# Patient Record
Sex: Female | Born: 1965 | Race: White | Hispanic: No | Marital: Married | Smoking: Former smoker
Health system: Southern US, Community
[De-identification: ages and names within clinical notes are randomized; demographics above are authoritative.]

## PROBLEM LIST (undated history)

## (undated) DIAGNOSIS — K219 Gastro-esophageal reflux disease without esophagitis: Secondary | ICD-10-CM

## (undated) DIAGNOSIS — C539 Malignant neoplasm of cervix uteri, unspecified: Secondary | ICD-10-CM

## (undated) DIAGNOSIS — G629 Polyneuropathy, unspecified: Secondary | ICD-10-CM

## (undated) DIAGNOSIS — Z1379 Encounter for other screening for genetic and chromosomal anomalies: Secondary | ICD-10-CM

## (undated) DIAGNOSIS — Z9889 Other specified postprocedural states: Secondary | ICD-10-CM

## (undated) DIAGNOSIS — C50919 Malignant neoplasm of unspecified site of unspecified female breast: Secondary | ICD-10-CM

## (undated) DIAGNOSIS — Z8719 Personal history of other diseases of the digestive system: Secondary | ICD-10-CM

## (undated) DIAGNOSIS — F329 Major depressive disorder, single episode, unspecified: Secondary | ICD-10-CM

## (undated) DIAGNOSIS — F32A Depression, unspecified: Secondary | ICD-10-CM

## (undated) DIAGNOSIS — R569 Unspecified convulsions: Secondary | ICD-10-CM

## (undated) DIAGNOSIS — A048 Other specified bacterial intestinal infections: Secondary | ICD-10-CM

## (undated) DIAGNOSIS — C4490 Unspecified malignant neoplasm of skin, unspecified: Secondary | ICD-10-CM

## (undated) HISTORY — PX: BREAST CYST ASPIRATION: SHX578

## (undated) HISTORY — DX: Encounter for other screening for genetic and chromosomal anomalies: Z13.79

## (undated) HISTORY — PX: APPENDECTOMY: SHX54

## (undated) HISTORY — PX: ABDOMINAL HYSTERECTOMY: SHX81

## (undated) HISTORY — DX: Unspecified malignant neoplasm of skin, unspecified: C44.90

## (undated) HISTORY — DX: Malignant neoplasm of cervix uteri, unspecified: C53.9

---

## 2000-08-26 ENCOUNTER — Other Ambulatory Visit: Admission: RE | Admit: 2000-08-26 | Discharge: 2000-08-26 | Payer: Self-pay | Admitting: Family Medicine

## 2003-01-13 HISTORY — PX: OOPHORECTOMY: SHX86

## 2004-01-13 HISTORY — PX: COLONOSCOPY: SHX174

## 2004-03-25 ENCOUNTER — Ambulatory Visit: Payer: Self-pay | Admitting: Unknown Physician Specialty

## 2004-04-02 ENCOUNTER — Ambulatory Visit: Payer: Self-pay | Admitting: Unknown Physician Specialty

## 2004-04-29 ENCOUNTER — Ambulatory Visit: Payer: Self-pay | Admitting: General Surgery

## 2004-05-20 ENCOUNTER — Ambulatory Visit: Payer: Self-pay | Admitting: General Surgery

## 2004-05-20 HISTORY — PX: CHOLECYSTECTOMY: SHX55

## 2005-01-12 DIAGNOSIS — R569 Unspecified convulsions: Secondary | ICD-10-CM

## 2005-01-12 HISTORY — PX: LASIK: SHX215

## 2005-01-12 HISTORY — DX: Unspecified convulsions: R56.9

## 2005-06-01 ENCOUNTER — Ambulatory Visit: Payer: Self-pay | Admitting: General Surgery

## 2006-04-27 ENCOUNTER — Ambulatory Visit: Payer: Self-pay | Admitting: Family Medicine

## 2006-05-31 ENCOUNTER — Ambulatory Visit: Payer: Self-pay | Admitting: Unknown Physician Specialty

## 2006-11-04 ENCOUNTER — Ambulatory Visit: Payer: Self-pay | Admitting: Otolaryngology

## 2006-11-10 IMAGING — NM NUCLEAR MEDICINE HEPATOHBILIARY INCLUDE GB
2 series · 12 of 12 positions shown · non-contrast
Comparison: none

REASON FOR EXAM: Nausea with vomiting, periumbilical abdominal pain
COMMENTS:

[Series 1: gallbladder dynamic (results) · 4.80mm/px · 6 of 60 frames shown]
[frame 6/60]
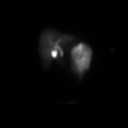
[frame 16/60]
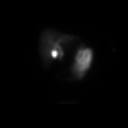
[frame 26/60]
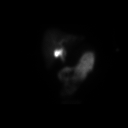
[frame 36/60]
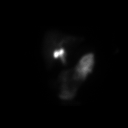
[frame 46/60]
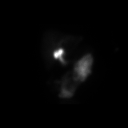
[frame 56/60]
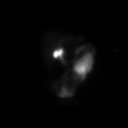

[Series 1: gallbladder dynamic · 4.80mm/px · 6 of 60 frames shown]
[frame 6/60]
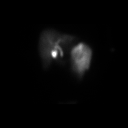
[frame 16/60]
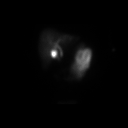
[frame 26/60]
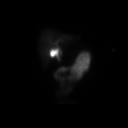
[frame 36/60]
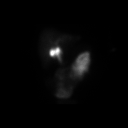
[frame 46/60]
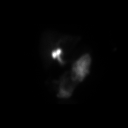
[frame 56/60]
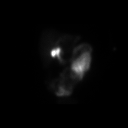

[12 of 12 positions shown; findings below may reference images not displayed]

PROCEDURE:     NM  - NM HEPATO WITH GB EJECT FRACTION  - March 25, 2004 [DATE]

RESULT:     Following injection of 7.20 millicuries of technetium-44m
Choletec, there was noted prompt visualization of the tracer activity in the
liver at five minutes.  At 40 minutes, tracer activity is visualized in the
gallbladder, common duct, and multiple loops of small bowel.  Following
injection of CCK, the gallbladder ejection fraction at 30 minutes measures
40% which is within the normal range.  A note is made that the patient
reported moderate cramping during the time of CCK injection.
IMPRESSION: Normal hepatobiliary scan.

The patient experienced cramping during the injection of CCK but the
gallbladder ejection fraction at 30 minutes measures 40% which is within the
normal range.

## 2008-03-05 ENCOUNTER — Ambulatory Visit: Payer: Self-pay | Admitting: General Practice

## 2008-06-12 ENCOUNTER — Ambulatory Visit: Payer: Self-pay | Admitting: Internal Medicine

## 2008-06-28 ENCOUNTER — Ambulatory Visit: Payer: Self-pay | Admitting: General Practice

## 2009-01-12 HISTORY — PX: BREAST BIOPSY: SHX20

## 2009-08-12 ENCOUNTER — Ambulatory Visit: Payer: Self-pay

## 2009-08-29 ENCOUNTER — Ambulatory Visit: Payer: Self-pay | Admitting: Unknown Physician Specialty

## 2009-08-29 HISTORY — PX: COLONOSCOPY W/ POLYPECTOMY: SHX1380

## 2009-09-05 ENCOUNTER — Ambulatory Visit: Payer: Self-pay

## 2009-10-07 HISTORY — PX: BREAST SURGERY: SHX581

## 2010-03-19 ENCOUNTER — Ambulatory Visit: Payer: Self-pay | Admitting: General Surgery

## 2011-01-22 ENCOUNTER — Ambulatory Visit: Payer: Self-pay | Admitting: Physician Assistant

## 2011-03-06 ENCOUNTER — Ambulatory Visit: Payer: Self-pay

## 2011-03-08 LAB — BETA STREP CULTURE(ARMC)

## 2011-03-26 ENCOUNTER — Ambulatory Visit: Payer: Self-pay | Admitting: General Surgery

## 2011-05-29 ENCOUNTER — Ambulatory Visit: Payer: Self-pay | Admitting: General Practice

## 2012-03-29 ENCOUNTER — Ambulatory Visit: Payer: Self-pay | Admitting: Physician Assistant

## 2012-04-21 ENCOUNTER — Ambulatory Visit: Payer: Self-pay | Admitting: Physician Assistant

## 2012-07-21 ENCOUNTER — Encounter: Payer: Self-pay | Admitting: *Deleted

## 2012-08-09 ENCOUNTER — Encounter: Payer: Self-pay | Admitting: *Deleted

## 2012-08-15 ENCOUNTER — Ambulatory Visit (INDEPENDENT_AMBULATORY_CARE_PROVIDER_SITE_OTHER): Payer: 59 | Admitting: General Surgery

## 2012-08-15 ENCOUNTER — Encounter: Payer: Self-pay | Admitting: General Surgery

## 2012-08-15 ENCOUNTER — Other Ambulatory Visit: Payer: Self-pay | Admitting: General Surgery

## 2012-08-15 VITALS — BP 108/70 | HR 78 | Resp 14 | Ht 66.0 in | Wt 160.0 lb

## 2012-08-15 DIAGNOSIS — K219 Gastro-esophageal reflux disease without esophagitis: Secondary | ICD-10-CM

## 2012-08-15 NOTE — Progress Notes (Signed)
Patient ID: Tammy Schroeder, female   DOB: 1965/05/12, 47 y.o.   MRN: [id number]  Chief Complaint  Patient presents with  . Other    Reflux    HPI Tammy Schroeder is a 47 y.o. female  Here for problems with reflux that have been ongoing for over a year. She has been treated twice for H-Pylori infections. She currently takes Omeprazole daily for symptom relief. She also has had problems with constipation her whole life. This is treated with laxatives every other day to relieve symptoms. Her last upper endoscopy was done in August 2011 by Dr Markham Jordan. Her last colonoscopy was done 08/29/2009 and the polyp found was benign.  At the time of her 04/14/2011 exam she had had a recurrent episode of abdominal pain similar to that noted in 2011 when she was noted to be positive for H. pylori. She received a course of Biaxin, amoxicillin and lansoprazole.  In the last 10 months she's had some recurrent reflux symptoms, usually when she misses a dose of omeprazole. As long as she is on therapy she is asymptomatic.  The patient has a long history of constipation. Multiple regimens have been tried. She's on success with daily stool softeners and every other day Correctol tablets.   The patient had previously been evaluated by Lynnae Prude, M.D. At the time of her 2011 followup exam after her endoscopy the staff was apparently somewhat curt, and she is decided not to return to that office.   HPI  History reviewed. No pertinent past medical history.  Past Surgical History  Procedure Laterality Date  . Appendectomy    . Cholecystectomy    . Abdominal hysterectomy      No family history on file.  Social History History  Substance Use Topics  . Smoking status: Former Smoker -- 1.00 packs/day for 29 years  . Smokeless tobacco: Never Used  . Alcohol Use: No    Allergies  Allergen Reactions  . Sulfa Antibiotics     Current Outpatient Prescriptions  Medication Sig Dispense Refill  . atorvastatin  (LIPITOR) 10 MG tablet Take 10 mg by mouth daily.      Tery Sanfilippo Sodium (CORRECTOL EXTRA GENTLE PO) Take 1 capsule by mouth daily.      Marland Kitchen estradiol (ESTRACE) 1 MG tablet Take 1 tablet by mouth daily.      Marland Kitchen zolpidem (AMBIEN) 5 MG tablet Take 2.5 mg by mouth at bedtime as needed for sleep.       No current facility-administered medications for this visit.    Review of Systems Review of Systems  Constitutional: Negative.   Respiratory: Negative.   Cardiovascular: Negative.   Gastrointestinal: Positive for constipation. Negative for nausea, vomiting, abdominal pain, diarrhea, blood in stool, abdominal distention, anal bleeding and rectal pain.    Blood pressure 108/70, pulse 78, resp. rate 14, height 5\' 6"  (1.676 m), weight 160 lb (72.576 kg).  Physical Exam Physical Exam  Constitutional: She is oriented to person, place, and time. She appears well-developed and well-nourished.  Neck: Neck supple.  Cardiovascular: Normal rate, regular rhythm and normal heart sounds.   Pulmonary/Chest: Effort normal and breath sounds normal.  Abdominal: Soft. Normal appearance and bowel sounds are normal. There is no hepatosplenomegaly. There is no tenderness.  Lymphadenopathy:    She has no cervical adenopathy.  Neurological: She is alert and oriented to person, place, and time.    Data Reviewed Upper lower endoscopy dated 08/29/2009 showed a hiatus hernia as well as gastritis. Biopsies  showed H. pylori at both the antrum and in the body of the stomach.  Colonoscopy showed a small polyp in the ascending colon measuring 4 mm in diameter. This was adenomatous.  H. pylori IgG testing of 08/29/2009 showed an elevated value of 4.7 (normal less than 0.9)  Assessment    Gastroesophageal reflux.    Plan    As the patient has noted more persistent symptoms in the last year repeat endoscopy is indicated. She has discontinued smoking in the last 2 years, which I would have expected to diminish her  symptoms.    Patient has been scheduled for an upper endoscopy on 08-23-12 at [facility].   Earline Mayotte 08/15/2012, 6:51 PM

## 2012-08-15 NOTE — Patient Instructions (Addendum)
Upper GI Endoscopy Upper GI endoscopy means using a flexible scope to look at the esophagus, stomach, and upper small bowel. This is done to make a diagnosis in people with heartburn, abdominal pain, or abnormal bleeding. Sometimes an endoscope is needed to remove foreign bodies or food that become stuck in the esophagus; it can also be used to take biopsy samples. For the best results, do not eat or drink for 8 hours before having your upper endoscopy.  To perform the endoscopy, you will probably be sedated and your throat will be numbed with a special spray. The endoscope is then slowly passed down your throat (this will not interfere with your breathing). An endoscopy exam takes 15 to 30 minutes to complete and there is no real pain. Patients rarely remember much about the procedure. The results of the test may take several days if a biopsy or other test is taken.  You may have a sore throat after an endoscopy exam. Serious complications are very rare. Stick to liquids and soft foods until your pain is better. Do not drive a car or operate any dangerous equipment for at least 24 hours after being sedated. SEEK IMMEDIATE MEDICAL CARE IF:   You have severe throat pain.   You have shortness of breath.   You have bleeding problems.   You have a fever.   You have difficulty recovering from your sedation.  Document Released: 02/06/2004 Document Revised: 12/18/2010 Document Reviewed: 01/01/2008 [facility] Patient Information 2012 [place], [place].  Patient has been scheduled for an upper endoscopy on 08-23-12 at [facility].

## 2012-08-22 ENCOUNTER — Telehealth: Payer: Self-pay | Admitting: *Deleted

## 2012-08-22 NOTE — Telephone Encounter (Signed)
Patient called and spoke with Marisue Humble this morning. This patient wishes to cancel upper endoscopy that was scheduled at [facility] for 08-23-12 due to finances. Trish in Endoscopy notified of cancellation.

## 2012-08-23 ENCOUNTER — Telehealth: Payer: Self-pay | Admitting: *Deleted

## 2012-08-23 DIAGNOSIS — A048 Other specified bacterial intestinal infections: Secondary | ICD-10-CM

## 2012-08-23 DIAGNOSIS — K219 Gastro-esophageal reflux disease without esophagitis: Secondary | ICD-10-CM

## 2012-08-23 NOTE — Telephone Encounter (Signed)
-----   Message from Tammy Mayotte, MD sent at 08/23/2012 4:05 PM ----- See if patient is amenable to getting an h pylori IgG blood test completed to assess for recurrent infection. Thanks.   Patient states she is willing to have repeat testing at [facility] lab. Lab slip has already been taken to the lab.

## 2012-08-23 NOTE — Telephone Encounter (Signed)
EGD cancelled. Will see if patient willing to have repeat H pylori testing to see if additional treatment is required.

## 2012-09-01 ENCOUNTER — Telehealth: Payer: Self-pay | Admitting: *Deleted

## 2012-09-01 NOTE — Telephone Encounter (Signed)
Patient states she has not had blood test completed yet because she works in [place]. She plans to have labs drawn tomorrow morning.

## 2012-09-03 LAB — H. PYLORI ANTIBODY, IGG: H Pylori IgG: 7.3 U/mL — ABNORMAL HIGH (ref 0.0–0.8)

## 2012-09-06 ENCOUNTER — Other Ambulatory Visit: Payer: Self-pay | Admitting: General Surgery

## 2012-09-06 ENCOUNTER — Telehealth: Payer: Self-pay | Admitting: *Deleted

## 2012-09-06 DIAGNOSIS — A048 Other specified bacterial intestinal infections: Secondary | ICD-10-CM

## 2012-09-06 MED ORDER — PANTOPRAZOLE SODIUM 40 MG PO TBEC: 40.0000 mg | DELAYED_RELEASE_TABLET | Freq: Two times a day (BID) | ORAL | Status: DC

## 2012-09-06 MED ORDER — AMOXICILLIN 500 MG PO CAPS: 1000.0000 mg | ORAL_CAPSULE | Freq: Two times a day (BID) | ORAL | Status: DC

## 2012-09-06 MED ORDER — CLARITHROMYCIN 500 MG PO TABS: 500.0000 mg | ORAL_TABLET | Freq: Two times a day (BID) | ORAL | Status: AC

## 2012-09-06 MED ORDER — FLUCONAZOLE 150 MG PO TABS: 150.0000 mg | ORAL_TABLET | ORAL | Status: DC

## 2012-09-06 NOTE — Telephone Encounter (Signed)
Please notify the patient that her blood test shows a new infection w/ H. Pylori. Would recommend RX. If amenable, I'll send in RXs.  Notified patient as instructed, Yes she would like RX called in .

## 2012-09-06 NOTE — Telephone Encounter (Signed)
Message copied by Currie Paris on Tue Sep 06, 2012 12:58 PM ------      Message from: Earline Mayotte      Created: Tue Sep 06, 2012 12:17 PM       Notify patient rx for H pylori infection sent to her pharmacy.  She should NOT take her lipitor while taking the antibiotics portion of treatment.  She should take Protonix BID for one month, the antibiotics for two weeks. Thanks.  ------

## 2012-09-06 NOTE — Telephone Encounter (Signed)
Notified patient as instructed. She requested Diflucan as well.

## 2013-03-30 ENCOUNTER — Ambulatory Visit (INDEPENDENT_AMBULATORY_CARE_PROVIDER_SITE_OTHER): Payer: 59 | Admitting: General Surgery

## 2013-03-30 ENCOUNTER — Other Ambulatory Visit: Payer: 59

## 2013-03-30 ENCOUNTER — Encounter: Payer: Self-pay | Admitting: General Surgery

## 2013-03-30 VITALS — BP 130/78 | HR 76 | Resp 12 | Ht 64.0 in | Wt 158.0 lb

## 2013-03-30 DIAGNOSIS — Z1231 Encounter for screening mammogram for malignant neoplasm of breast: Secondary | ICD-10-CM

## 2013-03-30 DIAGNOSIS — N63 Unspecified lump in unspecified breast: Secondary | ICD-10-CM

## 2013-03-30 DIAGNOSIS — N6459 Other signs and symptoms in breast: Secondary | ICD-10-CM

## 2013-03-30 NOTE — Progress Notes (Signed)
Patient ID: Tammy Schroeder, female   DOB: 06-Sep-1965, 48 y.o.   MRN: [id number]  Chief Complaint  Patient presents with  . Other    New lump left breast. No mammogram     HPI Tammy Schroeder is a 48 y.o. female who presents for an evaluation of a new left breast lump. No mammogram at this time. The patient noticed a left breast lump approximately 1 month ago. It's located in the outer quadrant of her left breast. This area was incidentally noted while she was trying on a number of new bras. There is no history of trauma to the area. Approximately 1 years ago she was changed from Estrace 2 Premarin. She denies any pain but does state she has a heavy sensation in that area. She does perform self breast checks and gets regular mammograms done. No injuries to the breasts.   HPI  History reviewed. No pertinent past medical history.  Past Surgical History  Procedure Laterality Date  . Appendectomy    . Abdominal hysterectomy    . Cholecystectomy  May 20, 2004    Chronic cholecystitis and cholelithiasis  . Colonoscopy w/ polypectomy N/A 08/29/2009    Tubular adenoma from the ascending colon. Gaylyn Cheers, M.D.  . Breast surgery Left 10/07/2009    Vacuum biopsy 2:00 position, benign breast tissue with fibrocystic and fibroadenoma-like changes.    Family History  Problem Relation Age of Onset  . Cancer Mother 65    DCIS  . Colon cancer Brother     Age 86, age 2  . Prostate cancer Father     Age at diagnosis unknown  . Lung cancer Father 58  . Uterine cancer Maternal Grandmother     Social History History  Substance Use Topics  . Smoking status: Former Smoker -- 1.00 packs/day for 29 years    Quit date: 04/02/2007  . Smokeless tobacco: Never Used  . Alcohol Use: No    Allergies  Allergen Reactions  . Sulfa Antibiotics     Current Outpatient Prescriptions  Medication Sig Dispense Refill  . atorvastatin (LIPITOR) 10 MG tablet Take 10 mg by mouth daily.      Mariane Baumgarten Sodium  (CORRECTOL EXTRA GENTLE PO) Take 1 capsule by mouth daily.      . pantoprazole (PROTONIX) 40 MG tablet Take 1 tablet (40 mg total) by mouth 2 (two) times daily.  60 tablet  1  . PREMARIN 0.9 MG tablet Take 1 tablet by mouth daily.      Marland Kitchen zolpidem (AMBIEN) 5 MG tablet Take 2.5 mg by mouth at bedtime as needed for sleep.       No current facility-administered medications for this visit.    Review of Systems Review of Systems  Constitutional: Negative.   Respiratory: Negative.   Cardiovascular: Negative.     Blood pressure 130/78, pulse 76, resp. rate 12, height 5\' 4"  (1.626 m), weight 158 lb (71.668 kg).  Physical Exam Physical Exam  Constitutional: She is oriented to person, place, and time. She appears well-developed and well-nourished.  Neck: Neck supple. No thyromegaly present.  Cardiovascular: Normal rate, regular rhythm and normal heart sounds.   No murmur heard. Pulmonary/Chest: Effort normal and breath sounds normal. Right breast exhibits no inverted nipple, no mass, no nipple discharge, no skin change and no tenderness. Left breast exhibits no inverted nipple, no mass, no nipple discharge, no skin change and no tenderness. Breasts are symmetrical.    Lymphadenopathy:    She has no cervical  adenopathy.    She has no axillary adenopathy.  Neurological: She is alert and oriented to person, place, and time.  Skin: Skin is warm and dry.    Data Reviewed Left breast biopsy dated October 08, 1999 and showed fibrocystic changes, sclerosing adenosis, apocrine metaplasia and a focal fibroadenoma-like changes. Microcalcifications were appreciated. No evidence of malignancy.  Ultrasound examination of the left breast was completed in the 2 to 4 o'clock position, the area of patient concern. No discernible areas of architectural distortion, cystic or solid lesions were identified. No images.  Baker Janus model risk: 2.5% 5 years, 21.4% lifetime.    Assessment    Benign breast exam.    Family history with multiple malignancies (colon, breast, prostate, long, uterine)  Ongoing estrogen use    Plan    Clinical examination and ultrasound of the area at this time are unremarkable. Arrangements have been made for screening mammogram early next month. If this is normal, observational as appropriate.     The patient will be asked to have a bilateral screening mammogram now. This has been arranged at [facility] for 04-19-13 at 8:30 am. She is aware of date, time, and instructions.    The patient was contacted by phone on the afternoon of 04/02/2013 to clarify her family history.  The patient's last colonoscopy was just over 3 years ago and showed only one small polyp. A repeat exam will be planned for 2016. Prior studies completed by Dr. Gaylyn Cheers. At the patient's last visit she spent 2 hours and never saw practitioner, and she decided to have the procedure completed elsewhere.  We discussed by phone the ongoing estrogen use and possible increased risk for breast cancer. At this time considering her profound vasomotor symptoms is unlikely that she be willing to discontinue this.  The patient may well benefit from genetic screening. Apparently when her brother for diagnosed with cancer she was not felt to be a candidate by her insurance company. Since that time her mother has been diagnosed with breast cancer and in light of the family history of prostate, breast, colon and uterine cancer she may now qualify for genetic screening.  We may not have access to genetic counselor at [facility] and the patient has been asked to contact this office if she is not heard further about genetic screening by 04/26/2013      Robert Bellow 04/02/2013, 1:13 PM

## 2013-03-30 NOTE — Patient Instructions (Addendum)
Patient to continue self breast checks. The patient is aware to call back for any questions or concerns. Patient to call after mammogram has been done.

## 2013-04-01 ENCOUNTER — Encounter: Payer: Self-pay | Admitting: General Surgery

## 2013-04-02 ENCOUNTER — Encounter: Payer: Self-pay | Admitting: General Surgery

## 2013-04-02 DIAGNOSIS — N6459 Other signs and symptoms in breast: Secondary | ICD-10-CM | POA: Insufficient documentation

## 2013-04-21 ENCOUNTER — Telehealth: Payer: Self-pay

## 2013-04-21 NOTE — Telephone Encounter (Signed)
Message copied by Lesly Rubenstein on Fri Apr 21, 2013 12:55 PM ------      Message from: Robert Bellow      Created: Fri Apr 21, 2013 11:11 AM       Please notify the patient that the mammograms were fine. Still working on Diplomatic Services operational officer. Thanks.      ----- Message -----         From: Verlene Mayer, CMA         Sent: 04/19/2013  10:22 PM           To: Robert Bellow, MD                   ------

## 2013-04-21 NOTE — Telephone Encounter (Signed)
Notified patient as instructed, patient pleased. Patient will await contact regarding genetic testing.

## 2013-04-25 ENCOUNTER — Telehealth: Payer: Self-pay | Admitting: *Deleted

## 2013-04-25 NOTE — Telephone Encounter (Signed)
History Mother breast cancer ae 35, uterine cancer age 48 Maternal grandmother uterine cancer 49 Brother colon cancer age 24 Brother colon cancer age 35

## 2013-04-25 NOTE — Telephone Encounter (Signed)
Message copied by Carson Myrtle on Tue Apr 25, 2013 12:22 PM ------      Message from: [place], [place] W      Created: Mon Apr 24, 2013 10:10 AM       Let's have the patient come in for genetic testing (my risk) based on family history of multiple cancers an early age. Patient will be expecting your call. Thank you ------

## 2013-04-26 NOTE — Telephone Encounter (Signed)
appt for 05-04-13 to complete testing

## 2013-05-02 ENCOUNTER — Telehealth: Payer: Self-pay | Admitting: *Deleted

## 2013-05-02 ENCOUNTER — Encounter: Payer: Self-pay | Admitting: *Deleted

## 2013-05-02 NOTE — Telephone Encounter (Signed)
Pt is coming to see you Thursday 05/04/13 for genetic testing and wanted to ask you a few questions about it.

## 2013-05-02 NOTE — Telephone Encounter (Signed)
Cervical cancer at age 48.

## 2013-05-04 ENCOUNTER — Ambulatory Visit (INDEPENDENT_AMBULATORY_CARE_PROVIDER_SITE_OTHER): Payer: 59 | Admitting: *Deleted

## 2013-05-04 DIAGNOSIS — Z1379 Encounter for other screening for genetic and chromosomal anomalies: Secondary | ICD-10-CM

## 2013-05-04 DIAGNOSIS — N63 Unspecified lump in unspecified breast: Secondary | ICD-10-CM

## 2013-05-04 HISTORY — DX: Encounter for other screening for genetic and chromosomal anomalies: Z13.79

## 2013-05-04 NOTE — Progress Notes (Signed)
Genetic testing completed. 

## 2013-05-04 NOTE — Patient Instructions (Signed)
Awaiting results

## 2013-06-06 ENCOUNTER — Telehealth: Payer: Self-pay | Admitting: *Deleted

## 2013-06-06 NOTE — Telephone Encounter (Signed)
Genetic testing was negative, her copy will be mailed, pt pleased.

## 2013-07-11 ENCOUNTER — Encounter: Payer: Self-pay | Admitting: General Surgery

## 2013-08-25 ENCOUNTER — Ambulatory Visit: Payer: Self-pay | Admitting: Family Medicine

## 2013-08-25 LAB — URINALYSIS, COMPLETE
Bilirubin,UR: NEGATIVE
Glucose,UR: NEGATIVE
KETONE: NEGATIVE
NITRITE: NEGATIVE
PROTEIN: NEGATIVE
Ph: 6 (ref 5.0–8.0)
SPECIFIC GRAVITY: 1.015 (ref 1.000–1.030)
SQUAMOUS EPITHELIAL: NONE SEEN

## 2013-08-27 LAB — URINE CULTURE

## 2013-11-13 ENCOUNTER — Encounter: Payer: Self-pay | Admitting: *Deleted

## 2014-04-11 ENCOUNTER — Ambulatory Visit
Admit: 2014-04-11 | Disposition: A | Discharge status: Home / Self Care | Payer: Self-pay | Attending: Obstetrics and Gynecology | Admitting: Obstetrics and Gynecology

## 2014-04-18 ENCOUNTER — Encounter: Payer: Self-pay | Admitting: *Deleted

## 2014-04-23 ENCOUNTER — Ambulatory Visit: Payer: Self-pay | Admitting: General Surgery

## 2014-05-04 ENCOUNTER — Encounter: Payer: Self-pay | Admitting: General Surgery

## 2014-05-07 ENCOUNTER — Ambulatory Visit (INDEPENDENT_AMBULATORY_CARE_PROVIDER_SITE_OTHER): Payer: 59 | Admitting: General Surgery

## 2014-05-07 ENCOUNTER — Encounter: Payer: Self-pay | Admitting: General Surgery

## 2014-05-07 ENCOUNTER — Ambulatory Visit: Payer: Self-pay | Admitting: General Surgery

## 2014-05-07 VITALS — BP 120/78 | HR 68 | Resp 14 | Ht 66.0 in | Wt 172.0 lb

## 2014-05-07 DIAGNOSIS — Z1211 Encounter for screening for malignant neoplasm of colon: Secondary | ICD-10-CM

## 2014-05-07 DIAGNOSIS — K5909 Other constipation: Secondary | ICD-10-CM | POA: Insufficient documentation

## 2014-05-07 DIAGNOSIS — K5900 Constipation, unspecified: Secondary | ICD-10-CM | POA: Diagnosis not present

## 2014-05-07 MED ORDER — POLYETHYLENE GLYCOL 3350 17 GM/SCOOP PO POWD
ORAL | Status: DC
Start: 2014-05-07 — End: 2014-08-22

## 2014-05-07 NOTE — Progress Notes (Signed)
Patient ID: Tammy Schroeder, female   DOB: 12-26-1965, 49 y.o.   MRN: [id number]  Chief Complaint  Patient presents with  . Other    colonoscopy    HPI Tammy Schroeder is a 49 y.o. female for an evaluation for a colonoscopy. Her last one was done by Dr Tiffany Kocher in 2011. She has a history of chronic constipation but no other digestive problems. The patient makes use of MiraLAX every day and usually makes use of Dulcolax twice a week to have a soft elimination. She does report modest lower abdominal discomfort on the days that she does not have a bowel movement.  HPI  Past Medical History  Diagnosis Date  . Cervical cancer     age 75  . Skin cancer 2013, 2014    Past Surgical History  Procedure Laterality Date  . Appendectomy    . Cholecystectomy  May 20, 2004    Chronic cholecystitis and cholelithiasis  . Colonoscopy w/ polypectomy N/A 08/29/2009    Tubular adenoma from the ascending colon. Gaylyn Cheers, M.D.  . Breast surgery Left 10/07/2009    Vacuum biopsy 2:00 position, benign breast tissue with fibrocystic and fibroadenoma-like changes.  . Oophorectomy  2005  . Abdominal hysterectomy  age 12    Family History  Problem Relation Age of Onset  . Breast cancer Mother 33    DCIS, Uterine cancer  . CVA Mother 33  . Colon cancer Brother     Age 84, age 50  . Prostate cancer Father   . Lung cancer Father 5    skin cancer  . Uterine cancer Maternal Grandmother     Social History History  Substance Use Topics  . Smoking status: Former Smoker -- 1.00 packs/day for 29 years    Quit date: 04/02/2007  . Smokeless tobacco: Never Used  . Alcohol Use: No    Allergies  Allergen Reactions  . Sulfa Antibiotics     Current Outpatient Prescriptions  Medication Sig Dispense Refill  . atorvastatin (LIPITOR) 10 MG tablet Take 10 mg by mouth daily.    Mariane Baumgarten Sodium (CORRECTOL EXTRA GENTLE PO) Take 1 capsule by mouth daily.    Marland Kitchen estradiol (ESTRACE) 1 MG tablet Take by  mouth.    . pantoprazole (PROTONIX) 40 MG tablet Take 1 tablet (40 mg total) by mouth 2 (two) times daily. 60 tablet 1  . polyethylene glycol (MIRALAX / GLYCOLAX) packet Take 17 g by mouth daily.    Marland Kitchen zolpidem (AMBIEN) 5 MG tablet Take 2.5 mg by mouth at bedtime as needed for sleep.    . polyethylene glycol powder (GLYCOLAX/MIRALAX) powder 255 grams one bottle for colonoscopy prep 255 g 0   No current facility-administered medications for this visit.    Review of Systems Review of Systems  Constitutional: Negative.   Respiratory: Negative.   Cardiovascular: Negative.   Gastrointestinal: Positive for constipation. Negative for nausea, vomiting, abdominal pain, diarrhea, blood in stool, abdominal distention, anal bleeding and rectal pain.    Blood pressure 120/78, pulse 68, resp. rate 14, height 5\' 6"  (1.676 m), weight 172 lb (78.019 kg).  Physical Exam Physical Exam  Constitutional: She is oriented to person, place, and time. She appears well-developed and well-nourished.  Eyes: Conjunctivae are normal. No scleral icterus.  Neck: Neck supple.  Cardiovascular: Normal rate, regular rhythm and normal heart sounds.   Pulmonary/Chest: Effort normal and breath sounds normal.  Abdominal: Soft. Bowel sounds are normal.  Lymphadenopathy:    She has  no cervical adenopathy.  Neurological: She is alert and oriented to person, place, and time.  Skin: Skin is warm and dry.    Data Reviewed Endoscopy report of 08/29/2009 showed a polyp of the ascending colon. This was a tubular adenoma without high-grade dysplasia. Upper endoscopy of the same date showed marked chronic and focal active gastritis with Helicobacter organisms present. Biopsies of the gastric body were negative.  Assessment    Adenomatous polyp of the ascending colon.  Chronic constipation.    Plan    The indications for follow-up colonoscopy were reviewed.    Colonoscopy with possible biopsy/polypectomy prn: Information  regarding the procedure, including its potential risks and complications (including but not limited to perforation of the bowel, which may require emergency surgery to repair, and bleeding) was verbally given to the patient. Educational information regarding lower instestinal endoscopy was given to the patient. Written instructions for how to complete the bowel prep using Miralax were provided. The importance of drinking ample fluids to avoid dehydration as a result of the prep emphasized.  Patient has been scheduled for a colonoscopy on 06-27-14 at [facility].   This patient has been asked to complete the following prep in additional to usual Miralax prep: take Dulcolax two (2) tablets the morning and evening two days prior to procedure and take two (2) tablets at 8 am and 2 pm the day prior to procedure.    Ref: Glenis Smoker NMW PCP: Dr Salome Holmes     Robert Bellow 05/07/2014, 10:13 PM

## 2014-05-07 NOTE — Patient Instructions (Addendum)
Colonoscopy A colonoscopy is an exam to look at the entire large intestine (colon). This exam can help find problems such as tumors, polyps, inflammation, and areas of bleeding. The exam takes about 1 hour.  LET [facility] CARE PROVIDER KNOW ABOUT:   Any allergies you have.  All medicines you are taking, including vitamins, herbs, eye drops, creams, and over-the-counter medicines.  Previous problems you or members of your family have had with the use of anesthetics.  Any blood disorders you have.  Previous surgeries you have had.  Medical conditions you have. RISKS AND COMPLICATIONS  Generally, this is a safe procedure. However, as with any procedure, complications can occur. Possible complications include:  Bleeding.  Tearing or rupture of the colon wall.  Reaction to medicines given during the exam.  Infection (rare). BEFORE THE PROCEDURE   Ask your health care provider about changing or stopping your regular medicines.  You may be prescribed an oral bowel prep. This involves drinking a large amount of medicated liquid, starting the day before your procedure. The liquid will cause you to have multiple loose stools until your stool is almost clear or light green. This cleans out your colon in preparation for the procedure.  Do not eat or drink anything else once you have started the bowel prep, unless your health care provider tells you it is safe to do so.  Arrange for someone to drive you home after the procedure. PROCEDURE   You will be given medicine to help you relax (sedative).  You will lie on your side with your knees bent.  A long, flexible tube with a light and camera on the end (colonoscope) will be inserted through the rectum and into the colon. The camera sends video back to a computer screen as it moves through the colon. The colonoscope also releases carbon dioxide gas to inflate the colon. This helps your health care provider see the area better.  During  the exam, your health care provider may take a small tissue sample (biopsy) to be examined under a microscope if any abnormalities are found.  The exam is finished when the entire colon has been viewed. AFTER THE PROCEDURE   Do not drive for 24 hours after the exam.  You may have a small amount of blood in your stool.  You may pass moderate amounts of gas and have mild abdominal cramping or bloating. This is caused by the gas used to inflate your colon during the exam.  Ask when your test results will be ready and how you will get your results. Make sure you get your test results. Document Released: 12/27/1999 Document Revised: 10/19/2012 Document Reviewed: 09/05/2012 [facility] Patient Information 2015 [place], [place]. This information is not intended to replace advice given to you by your health care provider. Make sure you discuss any questions you have with your health care provider.  Patient has been scheduled for a colonoscopy on 06-27-14 at [facility].   This patient has been asked to complete the following prep in additional to usual Miralax prep: take Dulcolax two (2) tablets the morning and evening two days prior to procedure and take two (2) tablets at 8 am and 2 pm the day prior to procedure.

## 2014-05-08 ENCOUNTER — Telehealth: Payer: Self-pay | Admitting: *Deleted

## 2014-05-08 NOTE — Telephone Encounter (Signed)
These contact the patient and arrange for testing to be completed.

## 2014-05-08 NOTE — Telephone Encounter (Signed)
Based on history- It should definitely meet criteria for COLARIS myRISK. Make sure to include all history of form.

## 2014-05-08 NOTE — Telephone Encounter (Signed)
-----   Message from Robert Bellow, MD sent at 05/07/2014 10:14 PM EDT ----- Please contact [place] and see if this patient is a candidate for genetic testing for colon cancer. Brother age 49 at diagnosis. Patient with previous colonic polyps in her 65s. Mother with breast cancer.

## 2014-05-09 ENCOUNTER — Encounter: Payer: Self-pay | Admitting: *Deleted

## 2014-05-09 ENCOUNTER — Telehealth: Payer: Self-pay | Admitting: *Deleted

## 2014-05-09 NOTE — Telephone Encounter (Signed)
Colaris Genetic testing negative on 05-04-13, pt pleased.

## 2014-05-22 ENCOUNTER — Ambulatory Visit: Payer: Self-pay

## 2014-05-23 ENCOUNTER — Ambulatory Visit
Admission: EM | Admit: 2014-05-23 | Discharge: 2014-05-23 | Disposition: A | Discharge status: Home / Self Care | Payer: 59 | Attending: Family Medicine | Admitting: Family Medicine

## 2014-05-23 ENCOUNTER — Encounter: Payer: Self-pay | Admitting: Emergency Medicine

## 2014-05-23 DIAGNOSIS — N760 Acute vaginitis: Secondary | ICD-10-CM

## 2014-05-23 DIAGNOSIS — N898 Other specified noninflammatory disorders of vagina: Secondary | ICD-10-CM | POA: Diagnosis not present

## 2014-05-23 LAB — URINALYSIS COMPLETE WITH MICROSCOPIC (ARMC ONLY)
Bacteria, UA: NONE SEEN — AB
Bilirubin Urine: NEGATIVE
Glucose, UA: NEGATIVE mg/dL
HGB URINE DIPSTICK: NEGATIVE
Ketones, ur: NEGATIVE mg/dL
Nitrite: NEGATIVE
PH: 7 (ref 5.0–8.0)
PROTEIN: NEGATIVE mg/dL
Specific Gravity, Urine: 1.01 (ref 1.005–1.030)

## 2014-05-23 LAB — WET PREP, GENITAL
Clue Cells Wet Prep HPF POC: NONE SEEN
Trich, Wet Prep: NONE SEEN
WBC WET PREP: NONE SEEN
Yeast Wet Prep HPF POC: NONE SEEN

## 2014-05-23 LAB — CHLAMYDIA/NGC RT PCR (ARMC ONLY)
Chlamydia Tr: NOT DETECTED
N gonorrhoeae: NOT DETECTED

## 2014-05-23 MED ORDER — FLUCONAZOLE 150 MG PO TABS: 150.0000 mg | ORAL_TABLET | Freq: Every day | ORAL | Status: DC

## 2014-05-23 NOTE — ED Notes (Signed)
Patient c/o vaginal irritation and burning since Sunday.  Patient denies discharge.  Patient denies N/V.

## 2014-05-23 NOTE — Discharge Instructions (Signed)
Recommend cool compresses on area prn, no other ointment on area, avoid sex for now until healed, if symptoms persist/worsen f/u with GYN

## 2014-05-23 NOTE — ED Provider Notes (Signed)
Patient presents today with irritation of the vaginal area. Patient states that she experienced burning pain after sexual encounter a few days ago. Patient states that she feels the symptoms are due to not using lubrication. Patient applied some a and D ointment and some over-the-counter vaginal anesthetic ointment to the area. Patient denies any genital lesions. Patient denies any history of STDs. Patient denies any fever, abdominal pain, urinary symptoms. Patient with hx of hysterectomy in menopause.   ROS: Negative except mentioned above.  GENERAL: NAD HEENT: no pharyngeal erythema, no exudate, no erythema of TMs, no cervical LAD RESP: CTA B CARD: RRR NEURO: CN II-XII groslly intact  GU: Mild erythema of the external vaginal area, no genital lesions noted, minimal clear discharge  A/P: Vaginal Irritation-wet prep was negative. Chlamydia gonorrhea swab pending. Patient would like to try Diflucan. Encouraged patient to use cool compresses on the area prn. Patient should follow-up with GYN next week if symptoms continue to persist.   Paulina Fusi, MD 05/23/14 [phone]

## 2014-06-22 ENCOUNTER — Other Ambulatory Visit: Payer: Self-pay | Admitting: General Surgery

## 2014-06-22 DIAGNOSIS — Z86010 Personal history of colonic polyps: Secondary | ICD-10-CM

## 2014-06-22 NOTE — H&P (Signed)
Patient ID: Tammy Schroeder, female DOB: 02-08-1965, 49 y.o. MRN: [id number]  Chief Complaint  Patient presents with  . Other    colonoscopy    HPI Tammy Schroeder is a 49 y.o. female for an evaluation for a colonoscopy. Her last one was done by Dr Tiffany Kocher in 2011. She has a history of chronic constipation but no other digestive problems. The patient makes use of MiraLAX every day and usually makes use of Dulcolax twice a week to have a soft elimination. She does report modest lower abdominal discomfort on the days that she does not have a bowel movement.  HPI  Past Medical History  Diagnosis Date  . Cervical cancer     age 58  . Skin cancer 2013, 2014    Past Surgical History  Procedure Laterality Date  . Appendectomy    . Cholecystectomy  May 20, 2004    Chronic cholecystitis and cholelithiasis  . Colonoscopy w/ polypectomy N/A 08/29/2009    Tubular adenoma from the ascending colon. Tammy Schroeder, M.D.  . Breast surgery Left 10/07/2009    Vacuum biopsy 2:00 position, benign breast tissue with fibrocystic and fibroadenoma-like changes.  . Oophorectomy  2005  . Abdominal hysterectomy  age 59    Family History  Problem Relation Age of Onset  . Breast cancer Mother 75    DCIS, Uterine cancer  . CVA Mother 8  . Colon cancer Brother     Age 73, age 84  . Prostate cancer Father   . Lung cancer Father 101    skin cancer  . Uterine cancer Maternal Grandmother     Social History History  Substance Use Topics  . Smoking status: Former Smoker -- 1.00 packs/day for 29 years    Quit date: 04/02/2007  . Smokeless tobacco: Never Used  . Alcohol Use: No    Allergies  Allergen Reactions  . Sulfa Antibiotics     Current Outpatient Prescriptions  Medication Sig Dispense Refill  . atorvastatin (LIPITOR) 10 MG tablet Take 10 mg by mouth  daily.    Mariane Baumgarten Sodium (CORRECTOL EXTRA GENTLE PO) Take 1 capsule by mouth daily.    Marland Kitchen estradiol (ESTRACE) 1 MG tablet Take by mouth.    . pantoprazole (PROTONIX) 40 MG tablet Take 1 tablet (40 mg total) by mouth 2 (two) times daily. 60 tablet 1  . polyethylene glycol (MIRALAX / GLYCOLAX) packet Take 17 g by mouth daily.    Marland Kitchen zolpidem (AMBIEN) 5 MG tablet Take 2.5 mg by mouth at bedtime as needed for sleep.    . polyethylene glycol powder (GLYCOLAX/MIRALAX) powder 255 grams one bottle for colonoscopy prep 255 g 0   No current facility-administered medications for this visit.    Review of Systems Review of Systems  Constitutional: Negative.  Respiratory: Negative.  Cardiovascular: Negative.  Gastrointestinal: Positive for constipation. Negative for nausea, vomiting, abdominal pain, diarrhea, blood in stool, abdominal distention, anal bleeding and rectal pain.    Blood pressure 120/78, pulse 68, resp. rate 14, height 5\' 6"  (1.676 m), weight 172 lb (78.019 kg).  Physical Exam Physical Exam  Constitutional: She is oriented to person, place, and time. She appears well-developed and well-nourished.  Eyes: Conjunctivae are normal. No scleral icterus.  Neck: Neck supple.  Cardiovascular: Normal rate, regular rhythm and normal heart sounds.  Pulmonary/Chest: Effort normal and breath sounds normal.  Abdominal: Soft. Bowel sounds are normal.  Lymphadenopathy:   She has no cervical adenopathy.  Neurological: She is alert and  oriented to person, place, and time.  Skin: Skin is warm and dry.    Data Reviewed Endoscopy report of 08/29/2009 showed a polyp of the ascending colon. This was a tubular adenoma without high-grade dysplasia. Upper endoscopy of the same date showed marked chronic and focal active gastritis with Helicobacter organisms present. Biopsies of the gastric body were negative.  Assessment    Adenomatous polyp of the ascending  colon.  Chronic constipation.    Plan    The indications for follow-up colonoscopy were reviewed.    Colonoscopy with possible biopsy/polypectomy prn: Information regarding the procedure, including its potential risks and complications (including but not limited to perforation of the bowel, which may require emergency surgery to repair, and bleeding) was verbally given to the patient. Educational information regarding lower instestinal endoscopy was given to the patient. Written instructions for how to complete the bowel prep using Miralax were provided. The importance of drinking ample fluids to avoid dehydration as a result of the prep emphasized.  Patient has been scheduled for a colonoscopy on 06-27-14 at [facility].   This patient has been asked to complete the following prep in additional to usual Miralax prep: take Dulcolax two (2) tablets the morning and evening two days prior to procedure and take two (2) tablets at 8 am and 2 pm the day prior to procedure.    Ref: Glenis Smoker NMW PCP: Dr Salome Holmes     Robert Bellow

## 2014-06-25 ENCOUNTER — Telehealth: Payer: Self-pay

## 2014-06-25 NOTE — Telephone Encounter (Signed)
Patient called and wants to reschedule her colonoscopy from 06/27/14 to after July 1st. Patient is scheduled for a colonoscopy at [facility] on 08/22/14. She is aware to pre register at least two days prior, she is aware of all of her prep instructions. Endoscopy aware of new date.

## 2014-08-15 ENCOUNTER — Telehealth: Payer: Self-pay | Admitting: *Deleted

## 2014-08-15 NOTE — Telephone Encounter (Signed)
Patient was contacted today and confirms no medication changes since last office visit. She also states she has Miralax prescription.   We will proceed with colonoscopy that is scheduled at [facility] for 08-22-14.  Patient instructed to call the office should she have further questions.

## 2014-08-22 ENCOUNTER — Encounter
Admission: RE | Disposition: A | Discharge status: Home / Self Care | Payer: Self-pay | Source: Ambulatory Visit | Attending: General Surgery

## 2014-08-22 ENCOUNTER — Ambulatory Visit: Payer: 59 | Admitting: Anesthesiology

## 2014-08-22 ENCOUNTER — Ambulatory Visit
Admission: RE | Admit: 2014-08-22 | Discharge: 2014-08-22 | Disposition: A | Discharge status: Home / Self Care | Payer: 59 | Source: Ambulatory Visit | Attending: General Surgery | Admitting: General Surgery

## 2014-08-22 DIAGNOSIS — Z79899 Other long term (current) drug therapy: Secondary | ICD-10-CM | POA: Insufficient documentation

## 2014-08-22 DIAGNOSIS — Z09 Encounter for follow-up examination after completed treatment for conditions other than malignant neoplasm: Secondary | ICD-10-CM | POA: Insufficient documentation

## 2014-08-22 DIAGNOSIS — F329 Major depressive disorder, single episode, unspecified: Secondary | ICD-10-CM | POA: Diagnosis not present

## 2014-08-22 DIAGNOSIS — Z85828 Personal history of other malignant neoplasm of skin: Secondary | ICD-10-CM | POA: Insufficient documentation

## 2014-08-22 DIAGNOSIS — K219 Gastro-esophageal reflux disease without esophagitis: Secondary | ICD-10-CM | POA: Insufficient documentation

## 2014-08-22 DIAGNOSIS — Z8541 Personal history of malignant neoplasm of cervix uteri: Secondary | ICD-10-CM | POA: Diagnosis not present

## 2014-08-22 DIAGNOSIS — Z8371 Family history of colonic polyps: Secondary | ICD-10-CM | POA: Diagnosis not present

## 2014-08-22 DIAGNOSIS — Z860100 Personal history of colon polyps, unspecified: Secondary | ICD-10-CM

## 2014-08-22 DIAGNOSIS — Z87891 Personal history of nicotine dependence: Secondary | ICD-10-CM | POA: Insufficient documentation

## 2014-08-22 HISTORY — PX: COLONOSCOPY: SHX5424

## 2014-08-22 SURGERY — COLONOSCOPY
Anesthesia: General

## 2014-08-22 MED ORDER — PROPOFOL 10 MG/ML IV BOLUS
INTRAVENOUS | Status: DC | PRN
  Administered 2014-08-22: 50 mg via INTRAVENOUS

## 2014-08-22 MED ORDER — EPHEDRINE SULFATE 50 MG/ML IJ SOLN
INTRAMUSCULAR | Status: DC | PRN
  Administered 2014-08-22: 15 mg via INTRAVENOUS
  Administered 2014-08-22: 10 mg via INTRAVENOUS

## 2014-08-22 MED ORDER — SODIUM CHLORIDE 0.9 % IV SOLN
INTRAVENOUS | Status: DC
  Administered 2014-08-22 (×2): via INTRAVENOUS

## 2014-08-22 MED ORDER — LIDOCAINE HCL (PF) 2 % IJ SOLN
INTRAMUSCULAR | Status: DC | PRN
  Administered 2014-08-22: 50 mg

## 2014-08-22 MED ORDER — MIDAZOLAM HCL 5 MG/5ML IJ SOLN
INTRAMUSCULAR | Status: DC | PRN
  Administered 2014-08-22 (×2): 1 mg via INTRAVENOUS

## 2014-08-22 MED ORDER — FENTANYL CITRATE (PF) 100 MCG/2ML IJ SOLN
INTRAMUSCULAR | Status: DC | PRN
  Administered 2014-08-22 (×2): 50 ug via INTRAVENOUS

## 2014-08-22 MED ORDER — PROPOFOL INFUSION 10 MG/ML OPTIME
INTRAVENOUS | Status: DC | PRN
  Administered 2014-08-22: 120 ug/kg/min via INTRAVENOUS

## 2014-08-22 NOTE — Anesthesia Postprocedure Evaluation (Signed)
  Anesthesia Post-op Note  Patient: Tammy Schroeder  Procedure(s) Performed: Procedure(s): COLONOSCOPY (N/A)  Anesthesia type:General  Patient location: PACU  Post pain: Pain level controlled  Post assessment: Post-op Vital signs reviewed, Patient's Cardiovascular Status Stable, Respiratory Function Stable, Patent Airway and No signs of Nausea or vomiting  Post vital signs: Reviewed and stable  Last Vitals:  Filed Vitals:   08/22/14 0957  BP: 118/78  Pulse: 76  Temp:   Resp: 12    Level of consciousness: awake, alert  and patient cooperative  Complications: No apparent anesthesia complications

## 2014-08-22 NOTE — Anesthesia Preprocedure Evaluation (Signed)
Anesthesia Evaluation  Patient identified by MRN, date of birth, ID band Patient awake    Reviewed: Allergy & Precautions, NPO status , Patient's Chart, lab work & pertinent test results  History of Anesthesia Complications (+) PONV  Airway Mallampati: II  TM Distance: >3 FB Neck ROM: Full    Dental  (+) Teeth Intact   Pulmonary former smoker (quit x 7 yrs),          Cardiovascular     Neuro/Psych Depression    GI/Hepatic GERD-  Medicated,  Endo/Other    Renal/GU      Musculoskeletal   Abdominal   Peds  Hematology   Anesthesia Other Findings   Reproductive/Obstetrics                             Anesthesia Physical Anesthesia Plan  ASA: II  Anesthesia Plan: General   Post-op Pain Management:    Induction: Intravenous  Airway Management Planned: Nasal Cannula  Additional Equipment:   Intra-op Plan:   Post-operative Plan:   Informed Consent: I have reviewed the patients History and Physical, chart, labs and discussed the procedure including the risks, benefits and alternatives for the proposed anesthesia with the patient or authorized representative who has indicated his/her understanding and acceptance.     Plan Discussed with:   Anesthesia Plan Comments:         Anesthesia Quick Evaluation

## 2014-08-22 NOTE — Discharge Instructions (Signed)
Resume your regular die and medications.  No driving for 24 hours.  A follow-up colonoscopy will be scheduled in 5 years.

## 2014-08-22 NOTE — Transfer of Care (Signed)
Immediate Anesthesia Transfer of Care Note  Patient: Tammy Schroeder  Procedure(s) Performed: Procedure(s): COLONOSCOPY (N/A)  Patient Location: PACU  Anesthesia Type:General  Level of Consciousness: sedated  Airway & Oxygen Therapy: Patient Spontanous Breathing and Patient connected to nasal cannula oxygen  Post-op Assessment: Report given to RN and Post -op Vital signs reviewed and stable  Post vital signs: Reviewed and stable  Last Vitals:  Filed Vitals:   08/22/14 0818  BP: 111/72  Pulse: 88  Temp: 36.7 C  Resp: 20    Complications: No apparent anesthesia complications

## 2014-08-22 NOTE — H&P (Signed)
Tammy Schroeder is an 49 y.o. female.   Chief Complaint: History colonic polyps HPI: 49 year old woman with previously identified adenomatous polyp of the ascending colon. For repeat exam. No recent GI symptoms.  Past Medical History  Diagnosis Date  . Cervical cancer     age 49  . Skin cancer 2013, 2014  . Genetic testing 05-04-13    negative colaris testing    Past Surgical History  Procedure Laterality Date  . Appendectomy    . Cholecystectomy  May 20, 2004    Chronic cholecystitis and cholelithiasis  . Colonoscopy w/ polypectomy N/A 08/29/2009    Tubular adenoma from the ascending colon. Gaylyn Cheers, M.D.  . Breast surgery Left 10/07/2009    Vacuum biopsy 2:00 position, benign breast tissue with fibrocystic and fibroadenoma-like changes.  . Oophorectomy  2005  . Abdominal hysterectomy  age 76  . Colonoscopy  2006    Family History  Problem Relation Age of Onset  . Breast cancer Mother 38    DCIS, Uterine cancer  . CVA Mother 84  . Colon cancer Brother     Age 33, age 25  . Prostate cancer Father   . Lung cancer Father 34    skin cancer  . Uterine cancer Maternal Grandmother    Social History:  reports that she quit smoking about 7 years ago. She has never used smokeless tobacco. She reports that she does not drink alcohol or use illicit drugs.  Allergies:  Allergies  Allergen Reactions  . Sulfa Antibiotics Itching    Medications Prior to Admission  Medication Sig Dispense Refill  . amitriptyline (ELAVIL) 25 MG tablet Take 1 tablet by mouth at bedtime.  11  . atorvastatin (LIPITOR) 10 MG tablet Take 10 mg by mouth daily.    Marland Kitchen BIOTIN PO Take 1 tablet by mouth daily.    Marland Kitchen CRANBERRY PO Take 2 tablets by mouth daily.    Mariane Baumgarten Sodium (CORRECTOL EXTRA GENTLE PO) Take 1 capsule by mouth daily as needed (constipation).     Marland Kitchen ELIDEL 1 % cream Apply 1 application topically daily.  0  . estradiol (ESTRACE) 1 MG tablet Take 1 mg by mouth daily.     . fluticasone  (FLONASE) 50 MCG/ACT nasal spray Place 2 sprays into both nostrils daily as needed. Allergies  11  . pantoprazole (PROTONIX) 40 MG tablet Take 1 tablet (40 mg total) by mouth 2 (two) times daily. (Patient taking differently: Take 40 mg by mouth daily. ) 60 tablet 1  . polyethylene glycol (MIRALAX / GLYCOLAX) packet Take 17 g by mouth daily as needed for moderate constipation.     . polyethylene glycol powder (GLYCOLAX/MIRALAX) powder 255 grams one bottle for colonoscopy prep 255 g 0  . SUMAtriptan (IMITREX) 100 MG tablet Take 1 tablet by mouth every 2 (two) hours as needed for migraine.   5  . zolpidem (AMBIEN) 10 MG tablet Take 5 mg by mouth at bedtime as needed for sleep.    . fluconazole (DIFLUCAN) 150 MG tablet Take 1 tablet (150 mg total) by mouth daily. 1 tablet 0    No results found for this or any previous visit (from the past 48 hour(s)). No results found.  Review of Systems  All other systems reviewed and are negative.   Blood pressure 111/72, pulse 88, temperature 98 F (36.7 C), temperature source Tympanic, resp. rate 20, height 5\' 6"  (1.676 m), weight 170 lb (77.111 kg), SpO2 100 %. Physical Exam  Constitutional:  She appears well-developed and well-nourished.  HENT:  Head: Atraumatic.  Neck: Neck supple. No thyromegaly present.  Cardiovascular: Normal rate and regular rhythm.   Respiratory: Effort normal and breath sounds normal.    Data: Endoscopy report of 08/29/2009 showed a polyp of the ascending colon. This was a tubular adenoma without high-grade dysplasia. Upper endoscopy of the same date showed marked chronic and focal active gastritis with Helicobacter organisms present. Biopsies of the gastric body were negative. Assessment/Plan Candidate for follow-up colonoscopy.  Robert Bellow 08/22/2014, 8:49 AM

## 2014-08-22 NOTE — Op Note (Signed)
[facility] Gastroenterology Patient Name: Tammy Schroeder Procedure Date: 08/22/2014 8:48 AM MRN: [id number] Account #: [id number] Date of Birth: Nov 06, 1965 Admit Type: Outpatient Age: 49 Room: [facility] ENDO ROOM 1 Gender: Female Note Status: Finalized Procedure:         Colonoscopy Indications:       High risk colon cancer surveillance: Personal history of                     colonic polyps Providers:         Robert Bellow, MD Referring MD:      Rubbie Battiest. Iona Beard, MD (Referring MD) Medicines:         Monitored Anesthesia Care Complications:     No immediate complications. Procedure:         Pre-Anesthesia Assessment:                    - Prior to the procedure, a History and Physical was                     performed, and patient medications, allergies and                     sensitivities were reviewed. The patient's tolerance of                     previous anesthesia was reviewed.                    - The risks and benefits of the procedure and the sedation                     options and risks were discussed with the patient. All                     questions were answered and informed consent was obtained.                    After obtaining informed consent, the colonoscope was                     passed under direct vision. Throughout the procedure, the                     patient's blood pressure, pulse, and oxygen saturations                     were monitored continuously. The Colonoscope was                     introduced through the anus and advanced to the the cecum,                     identified by appendiceal orifice and ileocecal valve. The                     colonoscopy was performed without difficulty. The patient                     tolerated the procedure well. The quality of the bowel                     preparation was excellent. Findings:      The entire examined colon appeared normal on direct and retroflexion  views. Impression:         - The entire examined colon is normal on direct and                     retroflexion views.                    - No specimens collected. Recommendation:    - Repeat colonoscopy in 5 years for surveillance. Diagnosis Code(s): --- Professional ---                    Z86.010, Personal history of colonic polyps Robert Bellow, MD 08/22/2014 9:23:56 AM This report has been signed electronically. Number of Addenda: 0 Note Initiated On: 08/22/2014 8:48 AM Scope Withdrawal Time: 0 hours 12 minutes 59 seconds  Total Procedure Duration: 0 hours 22 minutes 13 seconds       [facility]

## 2014-08-24 ENCOUNTER — Encounter: Payer: Self-pay | Admitting: General Surgery

## 2014-12-10 ENCOUNTER — Telehealth: Payer: Self-pay | Admitting: General Surgery

## 2014-12-10 NOTE — Telephone Encounter (Signed)
I CALLED PT TODAY 12-10-14 @ 9:29AM & EXPLAINED ABOUT THE REASON SHE WAS CHARGED FOR A DX COLONOSCOPY.MAUREEN STATED IT WAS BECAUSE PAST HX OF POLYPS & PERSONAL HX CONSITPATION & FM HX OF POLYS & COLON CA.THE PT STATED SHE WAS TOLD IF SHE WAITED UNTIL HER NEW INS THIS WOULD BE A SCREENING. SHE STATED SHE WAS NOT TOLD AHEAD OF PROCDURE THIS WOULD BE A DX.SHE REMEMBERS RECEIVING A LETTER & BELIEVES IT WAS FROM [place].SHE WILL CALL BACK WITH LETTER.STATES IT HAS COST HER $800.00/MTH

## 2014-12-10 NOTE — Telephone Encounter (Signed)
I HAVE L/M TODAY 12-10-14 @ 9:15AM ASKING TO RET MY CALL.SHE CALLED 11-30-14 ASKING WHY SHE WAS CHARGED FOR A DX COLONOSCOPY DOS 08-22-14. SHE WAS CHARGED BECAUSE OF HER PAST HX OF POLYPS/CONSTIPATION  & FM HX OF POLYPS & COLON CA PER MAUREEN.

## 2014-12-25 NOTE — Telephone Encounter (Signed)
12-25-14 DR BYRNETT I'VE SPOKEN TO THE PT SEVERAL TIMES REGARDING  HER DX COLONOSCOPY DONE ON 08-22-14. SHE WOULD LIKE TO KNOW FOR THE FUTURE COLONOSCOPIES IF THEY WILL BE CONSIDERED  DX.SHE HAS A PAST HX OF POLYPS/ PERSONAL HX OF CONSTIPATION & FAM HX OF POLYPS & COLON CA. PLEASE NOTIFY PT @ [phone]

## 2014-12-25 NOTE — Telephone Encounter (Signed)
The patient will have a diagnostic study in 2021 her history of polyps in 2011.  With her brother having colon cancer in his 60s, she will likely be encouraged to have exams every 5 years, which will represent a diagnostic study rather than screening.

## 2014-12-25 NOTE — Telephone Encounter (Signed)
12-25-14 PT CALLED BACK & WAS UNSURE OF THE CONVERSATION WE HAD ON 12-10-14. THE INFO WAS THEN REVIEWED WITH PT. SHE WAS UNABLE TO FIND THE LETTER WE SENT HER & I AM UNABLE TO FIND RECORD OF IT. SHE'S UNHAPPY REGARDING HER COLONOSCOPY BEING CHARGED AS A DX COLONOSCOPY AN STATES WAS NOT MADE AWARE PRIOR TO PROCEDURE. SHE HAS ASK IF BECAUSE OF HER PERSONAL & FM HX IF IT WILL ALWAYS BE A DX COLONOSCOPY. I WILL SEND A MESSAGE TO DR BYRNETT ASKING THE QUESTION AND FOR [place] TO CONTACT THE PT WITH HIS RESPONSE @ [phone]

## 2015-03-26 ENCOUNTER — Other Ambulatory Visit: Payer: Self-pay | Admitting: Obstetrics and Gynecology

## 2015-03-26 DIAGNOSIS — Z1231 Encounter for screening mammogram for malignant neoplasm of breast: Secondary | ICD-10-CM

## 2015-04-17 ENCOUNTER — Ambulatory Visit
Admission: RE | Admit: 2015-04-17 | Discharge: 2015-04-17 | Disposition: A | Discharge status: Home / Self Care | Payer: 59 | Source: Ambulatory Visit | Attending: Obstetrics and Gynecology | Admitting: Obstetrics and Gynecology

## 2015-04-17 DIAGNOSIS — Z1231 Encounter for screening mammogram for malignant neoplasm of breast: Secondary | ICD-10-CM | POA: Diagnosis present

## 2015-11-22 ENCOUNTER — Ambulatory Visit
Admission: EM | Admit: 2015-11-22 | Discharge: 2015-11-22 | Disposition: A | Discharge status: Home / Self Care | Payer: 59 | Attending: Family Medicine | Admitting: Family Medicine

## 2015-11-22 DIAGNOSIS — H6502 Acute serous otitis media, left ear: Secondary | ICD-10-CM

## 2015-11-22 MED ORDER — FLUCONAZOLE 150 MG PO TABS: 150.0000 mg | ORAL_TABLET | Freq: Every day | ORAL | 0 refills | Status: DC

## 2015-11-22 MED ORDER — AMOXICILLIN 875 MG PO TABS
875.0000 mg | ORAL_TABLET | Freq: Two times a day (BID) | ORAL | 0 refills | Status: DC
Start: 2015-11-22 — End: 2017-05-31

## 2015-11-22 NOTE — ED Triage Notes (Signed)
Patient complains of sore throat, left ear pain and hoarseness. Patient states that she has had symptoms x 2 days with a cough. Patient also reports that she has had a lot of sinus drainage.

## 2015-11-22 NOTE — ED Provider Notes (Signed)
[facility]    CSN: [id number] Arrival date & time: 11/22/15  0845     History   Chief Complaint Chief Complaint  Patient presents with  . Sore Throat    HPI Tammy Schroeder is a 50 y.o. female.   The history is provided by the patient.  Sore Throat  Pertinent negatives include no headaches.  URI  Presenting symptoms: congestion, ear pain, fever and sore throat   Severity:  Moderate Onset quality:  Sudden Duration:  3 days Timing:  Constant Progression:  Worsening Chronicity:  New Relieved by:  Nothing Ineffective treatments:  OTC medications Associated symptoms: no arthralgias, no headaches, no myalgias, no neck pain, no sinus pain, no sneezing, no swollen glands and no wheezing   Risk factors: sick contacts   Risk factors: not elderly, no chronic cardiac disease, no chronic kidney disease, no chronic respiratory disease, no diabetes mellitus, no immunosuppression, no recent illness and no recent travel     Past Medical History:  Diagnosis Date  . Cervical cancer [facility])    age 50  . Genetic testing 05-04-13   negative colaris testing  . Skin cancer 2013, 2014    Patient Active Problem List   Diagnosis Date Noted  . Encounter for screening colonoscopy 05/07/2014  . Chronic constipation 05/07/2014  . Breast symptom 04/02/2013  . GERD (gastroesophageal reflux disease) 08/15/2012    Past Surgical History:  Procedure Laterality Date  . ABDOMINAL HYSTERECTOMY  age 50  . APPENDECTOMY    . BREAST BIOPSY Left    bx/clip-neg  . BREAST CYST ASPIRATION Right    neg  . BREAST SURGERY Left 10/07/2009   Vacuum biopsy 2:00 position, benign breast tissue with fibrocystic and fibroadenoma-like changes.  . CHOLECYSTECTOMY  May 20, 2004   Chronic cholecystitis and cholelithiasis  . COLONOSCOPY  2006  . COLONOSCOPY N/A 08/22/2014   Procedure: COLONOSCOPY;  Surgeon: Robert Bellow, MD;  Location: [facility] ENDOSCOPY;  Service: Endoscopy;  Laterality: N/A;  .  COLONOSCOPY W/ POLYPECTOMY N/A 08/29/2009   Tubular adenoma from the ascending colon. Gaylyn Cheers, M.D.  . OOPHORECTOMY  2005    OB History    Gravida Para Term Preterm AB Living   2 2       2    SAB TAB Ectopic Multiple Live Births                  Obstetric Comments   1st Menstrual Cycle: 11  1st Pregnancy:  18       Home Medications    Prior to Admission medications   Medication Sig Start Date End Date Taking? Authorizing Provider  amitriptyline (ELAVIL) 25 MG tablet Take 1 tablet by mouth at bedtime. 05/28/14  Yes Historical Provider, MD  atorvastatin (LIPITOR) 10 MG tablet Take 10 mg by mouth daily.   Yes Historical Provider, MD  BIOTIN PO Take 1 tablet by mouth daily.   Yes Historical Provider, MD  CRANBERRY PO Take 2 tablets by mouth daily.   Yes Historical Provider, MD  Docusate Sodium (CORRECTOL EXTRA GENTLE PO) Take 1 capsule by mouth daily as needed (constipation).    Yes Historical Provider, MD  ELIDEL 1 % cream Apply 1 application topically daily. 05/03/14  Yes Historical Provider, MD  estradiol (ESTRACE) 0.5 MG tablet Take 0.5 mg by mouth daily.   Yes Historical Provider, MD  fluticasone (FLONASE) 50 MCG/ACT nasal spray Place 2 sprays into both nostrils daily as needed. Allergies 05/23/14  Yes Historical Provider,  MD  meloxicam (MOBIC) 15 MG tablet Take 15 mg by mouth daily.   Yes Historical Provider, MD  pantoprazole (PROTONIX) 40 MG tablet Take 1 tablet (40 mg total) by mouth 2 (two) times daily. Patient taking differently: Take 40 mg by mouth daily.  09/06/12  Yes Robert Bellow, MD  polyethylene glycol [facility] / GLYCOLAX) packet Take 17 g by mouth daily as needed for moderate constipation.    Yes Historical Provider, MD  SUMAtriptan (IMITREX) 100 MG tablet Take 1 tablet by mouth every 2 (two) hours as needed for migraine.  05/25/14  Yes Historical Provider, MD  zolpidem (AMBIEN) 10 MG tablet Take 5 mg by mouth at bedtime as needed for sleep.   Yes Historical  Provider, MD  amoxicillin (AMOXIL) 875 MG tablet Take 1 tablet (875 mg total) by mouth 2 (two) times daily. 11/22/15   Norval Gable, MD  fluconazole (DIFLUCAN) 150 MG tablet Take 1 tablet (150 mg total) by mouth daily. 11/22/15   Norval Gable, MD    Family History Family History  Problem Relation Age of Onset  . CVA Mother 23  . Breast cancer Mother 2    DCIS, Uterine cancer  . Colon cancer Brother     Age 46, age 90  . Prostate cancer Father   . Lung cancer Father 35    skin cancer  . Uterine cancer Maternal Grandmother     Social History Social History  Substance Use Topics  . Smoking status: Former Smoker    Packs/day: 1.00    Years: 29.00    Quit date: 04/02/2007  . Smokeless tobacco: Never Used  . Alcohol use No     Allergies   Sulfa antibiotics   Review of Systems Review of Systems  Constitutional: Positive for fever.  HENT: Positive for congestion, ear pain and sore throat. Negative for sinus pain and sneezing.   Respiratory: Negative for wheezing.   Musculoskeletal: Negative for arthralgias, myalgias and neck pain.  Neurological: Negative for headaches.     Physical Exam Triage Vital Signs ED Triage Vitals  Enc Vitals Group     BP 11/22/15 0933 117/82     Pulse Rate 11/22/15 0933 70     Resp 11/22/15 0933 16     Temp 11/22/15 0933 97.7 F (36.5 C)     Temp Source 11/22/15 0933 Oral     SpO2 11/22/15 0933 100 %     Weight 11/22/15 0931 175 lb (79.4 kg)     Height 11/22/15 0931 5\' 6"  (1.676 m)     Head Circumference --      Peak Flow --      Pain Score 11/22/15 0933 4     Pain Loc --      Pain Edu? --      Excl. in [place]? --    No data found.   Updated Vital Signs BP 117/82 (BP Location: Left Arm)   Pulse 70   Temp 97.7 F (36.5 C) (Oral)   Resp 16   Ht 5\' 6"  (1.676 m)   Wt 175 lb (79.4 kg)   SpO2 100%   BMI 28.25 kg/m   Visual Acuity Right Eye Distance:   Left Eye Distance:   Bilateral Distance:    Right Eye Near:   Left Eye  Near:    Bilateral Near:     Physical Exam  Constitutional: She appears well-developed and well-nourished. No distress.  HENT:  Head: Normocephalic and atraumatic.  Right Ear: Tympanic  membrane, external ear and ear canal normal.  Left Ear: Tympanic membrane, external ear and ear canal normal.  Nose: Rhinorrhea present. No mucosal edema, nose lacerations, sinus tenderness, nasal deformity, septal deviation or nasal septal hematoma. No epistaxis.  No foreign bodies. Right sinus exhibits no maxillary sinus tenderness and no frontal sinus tenderness. Left sinus exhibits no maxillary sinus tenderness and no frontal sinus tenderness.  Mouth/Throat: Uvula is midline and mucous membranes are normal. Posterior oropharyngeal erythema present. No oropharyngeal exudate, posterior oropharyngeal edema or tonsillar abscesses. No tonsillar exudate.  Eyes: Conjunctivae and EOM are normal. Pupils are equal, round, and reactive to light. Right eye exhibits no discharge. Left eye exhibits no discharge. No scleral icterus.  Neck: Normal range of motion. Neck supple. No thyromegaly present.  Cardiovascular: Normal rate, regular rhythm and normal heart sounds.   Pulmonary/Chest: Effort normal and breath sounds normal. No respiratory distress. She has no wheezes. She has no rales.  Lymphadenopathy:    She has no cervical adenopathy.  Skin: She is not diaphoretic.  Nursing note and vitals reviewed.    UC Treatments / Results  Labs (all labs ordered are listed, but only abnormal results are displayed) Labs Reviewed - No data to display  EKG  EKG Interpretation None       Radiology No results found.  Procedures Procedures (including critical care time)  Medications Ordered in UC Medications - No data to display   Initial Impression / Assessment and Plan / UC Course  I have reviewed the triage vital signs and the nursing notes.  Pertinent labs & imaging results that were available during my care  of the patient were reviewed by me and considered in my medical decision making (see chart for details).  Clinical Course       Final Clinical Impressions(s) / UC Diagnoses   Final diagnoses:  Acute serous otitis media of left ear, recurrence not specified    New Prescriptions Discharge Medication List as of 11/22/2015  9:43 AM    START taking these medications   Details  amoxicillin (AMOXIL) 875 MG tablet Take 1 tablet (875 mg total) by mouth 2 (two) times daily., Starting Fri 11/22/2015, Normal       1. diagnosis reviewed with patient  2. rx as per orders above; reviewed possible side effects, interactions, risks and benefits  3. Follow-up prn if symptoms worsen or don't improve   Norval Gable, MD 11/22/15 1011

## 2016-03-04 ENCOUNTER — Other Ambulatory Visit: Payer: Self-pay | Admitting: Obstetrics and Gynecology

## 2016-03-04 DIAGNOSIS — Z1231 Encounter for screening mammogram for malignant neoplasm of breast: Secondary | ICD-10-CM

## 2016-04-21 ENCOUNTER — Ambulatory Visit
Admission: RE | Admit: 2016-04-21 | Discharge: 2016-04-21 | Disposition: A | Discharge status: Home / Self Care | Payer: 59 | Source: Ambulatory Visit | Attending: Obstetrics and Gynecology | Admitting: Obstetrics and Gynecology

## 2016-04-21 DIAGNOSIS — Z1231 Encounter for screening mammogram for malignant neoplasm of breast: Secondary | ICD-10-CM | POA: Diagnosis not present

## 2016-10-03 ENCOUNTER — Telehealth: Payer: Self-pay | Admitting: General Surgery

## 2016-10-03 NOTE — Telephone Encounter (Signed)
Patient reports she was well until about 5: 30 PM today when she had severe rectal pain: "Like passing broken glass" during an attempted BM. No bleeding reported. Pain not described as a focal area, rather the entire anal area.  Long history slow GI function. Last BM 2-3 days ago (routine interval). Has PrepH at home.  Options reviewed: ED or home RX. Tylenol and Aleve (2) or Advil (4) po now. Mix PrepH and OTC hydrocortisone cream and apply to anal area. OTC suppositories for comfort. Heat/ ice for comfort. To call Monday, September 24 in AM w/ progress report. Will see in the office that PM if she is not improving and has elected not to go to the ED.

## 2017-05-18 ENCOUNTER — Other Ambulatory Visit: Payer: Self-pay | Admitting: Family Medicine

## 2017-05-18 DIAGNOSIS — Z1231 Encounter for screening mammogram for malignant neoplasm of breast: Secondary | ICD-10-CM

## 2017-05-19 ENCOUNTER — Ambulatory Visit
Admission: RE | Admit: 2017-05-19 | Discharge: 2017-05-19 | Disposition: A | Discharge status: Home / Self Care | Payer: BLUE CROSS/BLUE SHIELD | Source: Ambulatory Visit | Attending: Family Medicine | Admitting: Family Medicine

## 2017-05-19 DIAGNOSIS — Z1231 Encounter for screening mammogram for malignant neoplasm of breast: Secondary | ICD-10-CM | POA: Diagnosis present

## 2017-05-19 DIAGNOSIS — R928 Other abnormal and inconclusive findings on diagnostic imaging of breast: Secondary | ICD-10-CM | POA: Diagnosis not present

## 2017-05-24 ENCOUNTER — Other Ambulatory Visit: Payer: Self-pay | Admitting: Family Medicine

## 2017-05-24 DIAGNOSIS — R928 Other abnormal and inconclusive findings on diagnostic imaging of breast: Secondary | ICD-10-CM

## 2017-05-25 ENCOUNTER — Telehealth: Payer: Self-pay | Admitting: General Surgery

## 2017-05-25 NOTE — Telephone Encounter (Signed)
Patient called in stating she needed to see Dr Bary Castilla for an abnormal mammo. She had a mammogram on 05-19-17 and it was incomplete added views needed. She was going to call to schedule and then call back to schedule an appointment if needed with [place].She was last seen by Dr Bary Castilla for her breast in 2015.

## 2017-05-27 ENCOUNTER — Ambulatory Visit
Admission: RE | Admit: 2017-05-27 | Discharge: 2017-05-27 | Disposition: A | Discharge status: Home / Self Care | Payer: BLUE CROSS/BLUE SHIELD | Source: Ambulatory Visit | Attending: Family Medicine | Admitting: Family Medicine

## 2017-05-27 DIAGNOSIS — R928 Other abnormal and inconclusive findings on diagnostic imaging of breast: Secondary | ICD-10-CM | POA: Diagnosis present

## 2017-05-27 DIAGNOSIS — R921 Mammographic calcification found on diagnostic imaging of breast: Secondary | ICD-10-CM | POA: Diagnosis present

## 2017-05-31 ENCOUNTER — Other Ambulatory Visit: Payer: Self-pay | Admitting: Family Medicine

## 2017-05-31 ENCOUNTER — Ambulatory Visit (INDEPENDENT_AMBULATORY_CARE_PROVIDER_SITE_OTHER): Payer: BLUE CROSS/BLUE SHIELD | Admitting: General Surgery

## 2017-05-31 ENCOUNTER — Encounter: Payer: Self-pay | Admitting: General Surgery

## 2017-05-31 VITALS — BP 138/78 | HR 74 | Resp 12 | Ht 66.0 in | Wt 189.0 lb

## 2017-05-31 DIAGNOSIS — R920 Mammographic microcalcification found on diagnostic imaging of breast: Secondary | ICD-10-CM | POA: Diagnosis not present

## 2017-05-31 DIAGNOSIS — R928 Other abnormal and inconclusive findings on diagnostic imaging of breast: Secondary | ICD-10-CM

## 2017-05-31 NOTE — Patient Instructions (Signed)
Patient to have a right breast stereo biopsy.

## 2017-05-31 NOTE — Progress Notes (Signed)
Patient ID: Tammy Schroeder, female   DOB: 01/16/1965, 52 y.o.   MRN: [id number]  Chief Complaint  Patient presents with  . Follow-up    mammogram     HPI Tammy Schroeder is a 52 y.o. female here to discuss result of her mammo130 78 gram. The most recent mammogram was done on 05/19/17 with added views on 05/27/17. Patient does perform regular self breast checks and gets regular mammograms done. Mother had breast cancer.     HPI  Past Medical History:  Diagnosis Date  . Cervical cancer [facility])    age 83  . Genetic testing 05-04-13   negative colaris testing  . Skin cancer 2013, 2014    Past Surgical History:  Procedure Laterality Date  . ABDOMINAL HYSTERECTOMY  age 82  . APPENDECTOMY    . BREAST BIOPSY Left 2011   bx/clip-Vacuum biopsy 2:00 position, benign breast tissue with fibrocystic and fibroadenoma-like changes.  Marland Kitchen BREAST CYST ASPIRATION Right    neg  . BREAST SURGERY Left 10/07/2009   Vacuum biopsy 2:00 position, benign breast tissue with fibrocystic and fibroadenoma-like changes.  . CHOLECYSTECTOMY  May 20, 2004   Chronic cholecystitis and cholelithiasis  . COLONOSCOPY  2006  . COLONOSCOPY N/A 08/22/2014   Procedure: COLONOSCOPY;  Surgeon: Robert Bellow, MD;  Location: [facility] ENDOSCOPY;  Service: Endoscopy;  Laterality: N/A;  . COLONOSCOPY W/ POLYPECTOMY N/A 08/29/2009   Tubular adenoma from the ascending colon. Gaylyn Cheers, M.D.  . Nelva Bush    Family History  Problem Relation Age of Onset  . CVA Mother 1  . Breast cancer Mother 32       DCIS, Uterine cancer  . Colon cancer Brother        Age 52, age 50  . Prostate cancer Father   . Lung cancer Father 95       skin cancer  . Uterine cancer Maternal Grandmother     Social History Social History   Tobacco Use  . Smoking status: Former Smoker    Packs/day: 1.00    Years: 29.00    Pack years: 29.00    Last attempt to quit: 04/02/2007    Years since quitting: 10.1  . Smokeless tobacco: Never  Used  Substance Use Topics  . Alcohol use: No  . Drug use: No    Allergies  Allergen Reactions  . Sulfa Antibiotics Itching    Current Outpatient Medications  Medication Sig Dispense Refill  . amitriptyline (ELAVIL) 25 MG tablet Take 1 tablet by mouth at bedtime.  11  . atorvastatin (LIPITOR) 10 MG tablet Take 10 mg by mouth daily.    Marland Kitchen BIOTIN PO Take 1 tablet by mouth daily.    Marland Kitchen CRANBERRY PO Take 2 tablets by mouth daily.    Mariane Baumgarten Sodium (CORRECTOL EXTRA GENTLE PO) Take 1 capsule by mouth daily as needed (constipation).     Marland Kitchen ELIDEL 1 % cream Apply 1 application topically daily.  0  . estradiol (ESTRACE) 0.5 MG tablet Take 2 mg by mouth daily.     . meloxicam (MOBIC) 15 MG tablet Take 15 mg by mouth daily.    . pantoprazole (PROTONIX) 40 MG tablet Take 1 tablet (40 mg total) by mouth 2 (two) times daily. (Patient taking differently: Take 40 mg by mouth daily. ) 60 tablet 1  . polyethylene glycol (MIRALAX / GLYCOLAX) packet Take 17 g by mouth daily as needed for moderate constipation.     . SUMAtriptan (IMITREX)  100 MG tablet Take 1 tablet by mouth every 2 (two) hours as needed for migraine.   5  . traZODone (DESYREL) 150 MG tablet Take by mouth at bedtime.     No current facility-administered medications for this visit.     Review of Systems Review of Systems  Constitutional: Negative.   Respiratory: Negative.   Cardiovascular: Negative.     Blood pressure 138/78, pulse 74, resp. rate 12, height 5\' 6"  (1.676 m), weight 189 lb (85.7 kg).  Physical Exam Physical Exam  Constitutional: She is oriented to person, place, and time. She appears well-developed and well-nourished.  Eyes: Conjunctivae are normal. No scleral icterus.  Neck: Neck supple.  Cardiovascular: Normal rate, regular rhythm and normal heart sounds.  Pulmonary/Chest: Effort normal and breath sounds normal. Right breast exhibits no inverted nipple, no mass, no nipple discharge, no skin change and no  tenderness. Left breast exhibits no inverted nipple, no mass, no nipple discharge, no skin change and no tenderness. Breasts are asymmetrical (right breast bigger than left ).  Lymphadenopathy:    She has no cervical adenopathy.    She has no axillary adenopathy.  Neurological: She is alert and oriented to person, place, and time.  Skin: Skin is warm and dry.  Psychiatric: She has a normal mood and affect.    Data Reviewed Screening mammograms of May 20, 2017 and April 21, 2016 were reviewed.  Most notable changes significant regression in residual breast parenchyma.  Additional diagnostic studies and ultrasound reports reviewed.  BI-RADS-4.    Assessment     Changing mammogram most likely related to involution of residual breast parenchyma.   Plan  The architectural distortion reported by the radiologist is best appreciated on the 3D images and 3D guided biopsy would be most appropriate.  The procedure was reviewed with the patient, she was familiar with previous 2D stereotactic biopsy.  The possibility that a benign biopsy may not satisfy the radiology staff was reviewed, we will cross this bridge when we come to it.  HPI, Physical Exam, Assessment and Plan have been scribed under the direction and in the presence of Robert Bellow, MD.  Gaspar Cola CMA.   I have completed the exam and reviewed the above documentation for accuracy and completeness.  I agree with the above.  Haematologist has been used and any errors in dictation or transcription are unintentional.  Hervey Ard, M.D., F.A.C.S.  The patient was asked to notify the office today she has the procedure so we can be on the look out for the report.  The possibility that a benign biopsy will  Robert Bellow 05/31/2017, 8:49 PM  Patient to be scheduled for a right breast 3D stereo biopsy at the [facility]. She will be contacted by the mammography folks to arrange a date and time.    Dominga Ferry, CMA

## 2017-06-08 ENCOUNTER — Ambulatory Visit
Admission: RE | Admit: 2017-06-08 | Discharge: 2017-06-08 | Disposition: A | Discharge status: Home / Self Care | Payer: BLUE CROSS/BLUE SHIELD | Source: Ambulatory Visit | Attending: Family Medicine | Admitting: Family Medicine

## 2017-06-08 DIAGNOSIS — R928 Other abnormal and inconclusive findings on diagnostic imaging of breast: Secondary | ICD-10-CM | POA: Insufficient documentation

## 2017-06-08 HISTORY — PX: BREAST BIOPSY: SHX20

## 2017-06-09 ENCOUNTER — Telehealth: Payer: Self-pay | Admitting: General Surgery

## 2017-06-09 ENCOUNTER — Telehealth: Payer: Self-pay | Admitting: *Deleted

## 2017-06-09 LAB — SURGICAL PATHOLOGY

## 2017-06-09 NOTE — Telephone Encounter (Signed)
Discussed biopsy results with the patient.  Anticipated, and we discussed the possibility of this type of result where additional studies/excision might be recommended.  We will arrange for an office visit next week to review in person or options. With the lack of atypia on a vacuum biopsy, the likelihood of upstaging to DCIS or invasive cancer is incredibly small.

## 2017-06-09 NOTE — Telephone Encounter (Signed)
Patient called and wanted to let you know that she has her breast stereo yesterday 06/08/17

## 2017-06-15 ENCOUNTER — Encounter
Admission: RE | Admit: 2017-06-15 | Discharge: 2017-06-15 | Disposition: A | Discharge status: Home / Self Care | Payer: BLUE CROSS/BLUE SHIELD | Source: Ambulatory Visit | Attending: General Surgery | Admitting: General Surgery

## 2017-06-15 ENCOUNTER — Ambulatory Visit (INDEPENDENT_AMBULATORY_CARE_PROVIDER_SITE_OTHER): Payer: BLUE CROSS/BLUE SHIELD | Admitting: General Surgery

## 2017-06-15 ENCOUNTER — Encounter: Payer: Self-pay | Admitting: General Surgery

## 2017-06-15 ENCOUNTER — Other Ambulatory Visit: Payer: Self-pay

## 2017-06-15 ENCOUNTER — Ambulatory Visit (INDEPENDENT_AMBULATORY_CARE_PROVIDER_SITE_OTHER): Payer: BLUE CROSS/BLUE SHIELD

## 2017-06-15 VITALS — BP 124/70 | HR 91 | Resp 13 | Ht 65.0 in | Wt 180.0 lb

## 2017-06-15 DIAGNOSIS — R920 Mammographic microcalcification found on diagnostic imaging of breast: Secondary | ICD-10-CM | POA: Diagnosis not present

## 2017-06-15 HISTORY — DX: Personal history of other diseases of the digestive system: Z87.19

## 2017-06-15 HISTORY — DX: Major depressive disorder, single episode, unspecified: F32.9

## 2017-06-15 HISTORY — DX: Other specified bacterial intestinal infections: A04.8

## 2017-06-15 HISTORY — DX: Polyneuropathy, unspecified: G62.9

## 2017-06-15 HISTORY — DX: Unspecified convulsions: R56.9

## 2017-06-15 HISTORY — DX: Gastro-esophageal reflux disease without esophagitis: K21.9

## 2017-06-15 HISTORY — DX: Depression, unspecified: F32.A

## 2017-06-15 NOTE — Patient Instructions (Addendum)
Your procedure is scheduled on: Friday, June 18, 2017 Report to Day Surgery on the 2nd floor of the [facility]. To find out your arrival time, please call [phone] between 1PM - 3PM on: Thursday, June 17, 2017  REMEMBER: Instructions that are not followed completely may result in serious medical risk, up to and including death; or upon the discretion of your surgeon and anesthesiologist your surgery may need to be rescheduled.  Do not eat food after midnight the night before your procedure.  No gum chewing, lozengers or hard candies.  You may however, drink CLEAR liquids up to 2 hours before you are scheduled to arrive for your surgery. Do not drink anything within 2 hours of the start of your surgery.  Clear liquids include: - water  - apple juice without pulp - clear gatorade - black coffee or tea (Do NOT add anything to the coffee or tea) Do NOT drink anything that is not on this list.  No Alcohol for 24 hours before or after surgery.  No Smoking including e-cigarettes for 24 hours prior to surgery.  No chewable tobacco products for at least 6 hours prior to surgery.  No nicotine patches on the day of surgery.  On the morning of surgery brush your teeth with toothpaste and water, you may rinse your mouth with mouthwash if you wish. Do not swallow any toothpaste or mouthwash.  Notify your doctor if there is any change in your medical condition (cold, fever, infection).  Do not wear jewelry, make-up, hairpins, clips or nail polish.  Do not wear lotions, powders, or perfumes. You may wear deodorant.  Do not shave 48 hours prior to surgery.   Contacts and dentures may not be worn into surgery.  Do not bring valuables to the hospital, including drivers license, insurance or credit cards.  [facility] is not responsible for any belongings or valuables.   TAKE THESE MEDICATIONS THE MORNING OF SURGERY:  1.  All H. Pylori medications:           Amoxicillin  Bismuth subsalicylate           Clarithromycin           Metronidazole            Tetracycline 2.  GABAPENTIN  NOW!  Stop Anti-inflammatories (NSAIDS) such as MELOXICAM, Advil, Aleve, Ibuprofen, Motrin, Naproxen, Naprosyn and Aspirin based products such as Excedrin, Goodys Powder, BC Powder. (May take Tylenol or Acetaminophen if needed.)  NOW!  Stop ANY OVER THE COUNTER supplements until after surgery. Drusilla Kanner) (May continue Vitamin B.)  Wear comfortable clothing (specific to your surgery type) to the hospital.  Plan for stool softeners for home use.  If you are being discharged the day of surgery, you will not be allowed to drive home. You will need a responsible adult to drive you home and stay with you that night.   If you are taking public transportation, you will need to have a responsible adult with you. Please confirm with your physician that it is acceptable to use public transportation.   Please call [phone] if you have any questions about these instructions.

## 2017-06-15 NOTE — Patient Instructions (Addendum)
Patient to have a right breast excision.  The patient is scheduled for surgery at [facility] on 06/18/17. She will pre admit by phone. The patient is aware of date and instructions.

## 2017-06-15 NOTE — Progress Notes (Signed)
Patient ID: Tammy Schroeder, female   DOB: [birth year], 52 y.o.   MRN: [id number]  Chief Complaint  Patient presents with  . Follow-up    HPI Tammy Schroeder is a 52 y.o. female here today to discuss her right breast stereo biopsy done on 06/08/2017.  As we had discussed as a possibility prior to the biopsy, she has received a "indeterminant" report. HPI  Past Medical History:  Diagnosis Date  . Cervical cancer [facility])    age 52  . Genetic testing 05-04-13   negative colaris testing  . Skin cancer 2013, 2014    Past Surgical History:  Procedure Laterality Date  . ABDOMINAL HYSTERECTOMY  age 53  . APPENDECTOMY    . BREAST BIOPSY Left 2011   bx/clip-Vacuum biopsy 2:00 position, benign breast tissue with fibrocystic and fibroadenoma-like changes.  Marland Kitchen BREAST BIOPSY Right 06/08/2017   affirm bx x clip    path pending  . BREAST CYST ASPIRATION Right    neg  . BREAST SURGERY Left 10/07/2009   Vacuum biopsy 2:00 position, benign breast tissue with fibrocystic and fibroadenoma-like changes.  . CHOLECYSTECTOMY  May 20, 2004   Chronic cholecystitis and cholelithiasis  . COLONOSCOPY  2006  . COLONOSCOPY N/A 08/22/2014   Procedure: COLONOSCOPY;  Surgeon: Robert Bellow, MD;  Location: [facility] ENDOSCOPY;  Service: Endoscopy;  Laterality: N/A;  . COLONOSCOPY W/ POLYPECTOMY N/A 08/29/2009   Tubular adenoma from the ascending colon. Gaylyn Cheers, M.D.  . Nelva Bush    Family History  Problem Relation Age of Onset  . CVA Mother 58  . Breast cancer Mother 69       DCIS, Uterine cancer  . Colon cancer Brother        Age 56, age 60  . Prostate cancer Father   . Lung cancer Father 5       skin cancer  . Uterine cancer Maternal Grandmother     Social History Social History   Tobacco Use  . Smoking status: Former Smoker    Packs/day: 1.00    Years: 29.00    Pack years: 29.00    Last attempt to quit: 04/02/2007    Years since quitting: 10.2  . Smokeless tobacco: Never Used   Substance Use Topics  . Alcohol use: No  . Drug use: No    Allergies  Allergen Reactions  . Sulfa Antibiotics Itching    Current Outpatient Medications  Medication Sig Dispense Refill  . amitriptyline (ELAVIL) 25 MG tablet Take 1 tablet by mouth at bedtime.  11  . amoxicillin (AMOXIL) 500 MG capsule Take by mouth.    Marland Kitchen atorvastatin (LIPITOR) 10 MG tablet Take 10 mg by mouth daily.    Marland Kitchen BIOTIN PO Take 1 tablet by mouth daily.    . Bismuth Subsalicylate 209 MG TABS Take by mouth.    . clarithromycin (BIAXIN) 500 MG tablet Take by mouth.    . CRANBERRY PO Take 2 tablets by mouth daily.    Mariane Baumgarten Sodium (CORRECTOL EXTRA GENTLE PO) Take 1 capsule by mouth daily as needed (constipation).     Marland Kitchen ELIDEL 1 % cream Apply 1 application topically daily.  0  . estradiol (ESTRACE) 0.5 MG tablet Take 2 mg by mouth daily.     . meloxicam (MOBIC) 15 MG tablet Take 15 mg by mouth daily.    . metroNIDAZOLE (FLAGYL) 500 MG tablet Take 500 mg by mouth 4 (four) times daily.    . polyethylene glycol (  MIRALAX / GLYCOLAX) packet Take 17 g by mouth daily as needed for moderate constipation.     . SUMAtriptan (IMITREX) 100 MG tablet Take 1 tablet by mouth every 2 (two) hours as needed for migraine.   5  . tetracycline (ACHROMYCIN,SUMYCIN) 500 MG capsule Take by mouth.    . traZODone (DESYREL) 150 MG tablet Take by mouth at bedtime.    . pantoprazole (PROTONIX) 40 MG tablet Take 1 tablet (40 mg total) by mouth 2 (two) times daily. (Patient not taking: Reported on 06/15/2017) 60 tablet 1   No current facility-administered medications for this visit.     Review of Systems Review of Systems  Constitutional: Negative.   Respiratory: Negative.   Cardiovascular: Negative.     Blood pressure 124/70, pulse 91, resp. rate 13, height 5\' 5"  (1.651 m), weight 180 lb (81.6 kg).  Physical Exam Physical Exam  Constitutional: She is oriented to person, place, and time. She appears well-developed and  well-nourished.  Cardiovascular: Normal rate, regular rhythm and normal heart sounds.  Pulmonary/Chest: Effort normal and breath sounds normal. Right breast exhibits no inverted nipple, no nipple discharge, no skin change and no tenderness. Left breast exhibits no inverted nipple, no mass, no nipple discharge, no skin change and no tenderness.  Neurological: She is alert and oriented to person, place, and time.  Skin: Skin is warm and dry.    Data Reviewed DIAGNOSIS:  A. BREAST, RIGHT, LOWER OUTER; STEREOTACTIC-GUIDED CORE BIOPSY:  - FEATURES CONSISTENT WITH COMPLEX SCLEROSING LESION, CONTAINING USUAL  DUCTAL HYPERPLASIA AND CALCIFICATIONS.  - NEGATIVE FOR ATYPIA AND MALIGNANCY.  Review of the pathology report suggested 2 cm of tissue were removed.  Ultrasound examination of the lower outer quadrant of the right breast was completed to determine if preoperative wire localization would be necessary.  The biopsy cavity is well-defined and is at the 730 o'clock position, 4 cm from the nipple.  This measures 0.9 x 1.6 x 1.66 cm.   Assessment    Radial scar, maternal history of breast cancer.    Plan  The less than 5% chance of anything additional being found after the volume of tissue removed at the time of biopsy is inadequate for patient mental comfort.  The patient reports that she thinks of her mother's breast cancer every day, and this indeterminate biopsy makes her uncomfortable.  In light of that it is reasonable to proceed to excision.  Patient to have a right breast excision.   HPI, Physical Exam, Assessment and Plan have been scribed under the direction and in the presence of Hervey Ard, MD.  Gaspar Cola, CMA  I have completed the exam and reviewed the above documentation for accuracy and completeness.  I agree with the above.  Haematologist has been used and any errors in dictation or transcription are unintentional.  Hervey Ard, M.D., F.A.C.S.  Tammy Schroeder 06/15/2017, 9:26 AM

## 2017-06-18 ENCOUNTER — Ambulatory Visit: Payer: BLUE CROSS/BLUE SHIELD | Admitting: Certified Registered Nurse Anesthetist

## 2017-06-18 ENCOUNTER — Ambulatory Visit
Admission: RE | Admit: 2017-06-18 | Discharge: 2017-06-18 | Disposition: A | Discharge status: Home / Self Care | Payer: BLUE CROSS/BLUE SHIELD | Source: Ambulatory Visit | Attending: General Surgery | Admitting: General Surgery

## 2017-06-18 ENCOUNTER — Encounter
Admission: RE | Disposition: A | Discharge status: Home / Self Care | Payer: Self-pay | Source: Ambulatory Visit | Attending: General Surgery

## 2017-06-18 ENCOUNTER — Ambulatory Visit: Payer: BLUE CROSS/BLUE SHIELD

## 2017-06-18 DIAGNOSIS — F329 Major depressive disorder, single episode, unspecified: Secondary | ICD-10-CM | POA: Insufficient documentation

## 2017-06-18 DIAGNOSIS — Z79899 Other long term (current) drug therapy: Secondary | ICD-10-CM | POA: Insufficient documentation

## 2017-06-18 DIAGNOSIS — N6310 Unspecified lump in the right breast, unspecified quadrant: Secondary | ICD-10-CM

## 2017-06-18 DIAGNOSIS — N6459 Other signs and symptoms in breast: Secondary | ICD-10-CM | POA: Diagnosis present

## 2017-06-18 DIAGNOSIS — D493 Neoplasm of unspecified behavior of breast: Secondary | ICD-10-CM | POA: Diagnosis not present

## 2017-06-18 DIAGNOSIS — K219 Gastro-esophageal reflux disease without esophagitis: Secondary | ICD-10-CM | POA: Diagnosis not present

## 2017-06-18 DIAGNOSIS — Z87891 Personal history of nicotine dependence: Secondary | ICD-10-CM | POA: Insufficient documentation

## 2017-06-18 DIAGNOSIS — N6489 Other specified disorders of breast: Secondary | ICD-10-CM | POA: Diagnosis not present

## 2017-06-18 HISTORY — PX: BREAST BIOPSY: SHX20

## 2017-06-18 SURGERY — BREAST BIOPSY
Anesthesia: General | Laterality: Right | Wound class: "Clean "

## 2017-06-18 MED ORDER — PHENYLEPHRINE HCL 10 MG/ML IJ SOLN
INTRAMUSCULAR | Status: DC | PRN
  Administered 2017-06-18: 100 ug via INTRAVENOUS

## 2017-06-18 MED ORDER — HYDROMORPHONE HCL 1 MG/ML IJ SOLN
INTRAMUSCULAR | Status: AC
  Administered 2017-06-18: 0.25 mg via INTRAVENOUS
  Filled 2017-06-18: qty 1

## 2017-06-18 MED ORDER — MIDAZOLAM HCL 2 MG/2ML IJ SOLN
INTRAMUSCULAR | Status: DC | PRN
  Administered 2017-06-18: 2 mg via INTRAVENOUS

## 2017-06-18 MED ORDER — HYDROCODONE-ACETAMINOPHEN 7.5-325 MG PO TABS
1.0000 | ORAL_TABLET | Freq: Once | ORAL | Status: AC | PRN
Start: 2017-06-18 — End: 2017-06-18
  Administered 2017-06-18: 1 via ORAL
  Filled 2017-06-18: qty 1

## 2017-06-18 MED ORDER — PROPOFOL 10 MG/ML IV BOLUS
INTRAVENOUS | Status: DC | PRN
  Administered 2017-06-18: 200 mg via INTRAVENOUS

## 2017-06-18 MED ORDER — LIDOCAINE HCL (CARDIAC) PF 100 MG/5ML IV SOSY
PREFILLED_SYRINGE | INTRAVENOUS | Status: DC | PRN
  Administered 2017-06-18: 100 mg via INTRAVENOUS

## 2017-06-18 MED ORDER — HYDROCODONE-ACETAMINOPHEN 7.5-325 MG PO TABS
ORAL_TABLET | ORAL | Status: AC
  Administered 2017-06-18: 1 via ORAL
  Filled 2017-06-18: qty 1

## 2017-06-18 MED ORDER — MEPERIDINE HCL 50 MG/ML IJ SOLN: 6.2500 mg | INTRAMUSCULAR | Status: DC | PRN

## 2017-06-18 MED ORDER — DEXAMETHASONE SODIUM PHOSPHATE 10 MG/ML IJ SOLN
INTRAMUSCULAR | Status: DC | PRN
  Administered 2017-06-18: 5 mg via INTRAVENOUS

## 2017-06-18 MED ORDER — HYDROMORPHONE HCL 1 MG/ML IJ SOLN
0.2500 mg | INTRAMUSCULAR | Status: DC | PRN
  Administered 2017-06-18: 0.25 mg via INTRAVENOUS
  Administered 2017-06-18 (×2): 0.5 mg via INTRAVENOUS

## 2017-06-18 MED ORDER — FENTANYL CITRATE (PF) 100 MCG/2ML IJ SOLN
INTRAMUSCULAR | Status: DC | PRN
  Administered 2017-06-18 (×2): 50 ug via INTRAVENOUS

## 2017-06-18 MED ORDER — EPHEDRINE SULFATE 50 MG/ML IJ SOLN
INTRAMUSCULAR | Status: DC | PRN
  Administered 2017-06-18 (×2): 5 mg via INTRAVENOUS
  Administered 2017-06-18 (×2): 10 mg via INTRAVENOUS

## 2017-06-18 MED ORDER — ACETAMINOPHEN 325 MG PO TABS: 325.0000 mg | ORAL_TABLET | ORAL | Status: DC | PRN

## 2017-06-18 MED ORDER — FENTANYL CITRATE (PF) 100 MCG/2ML IJ SOLN
INTRAMUSCULAR | Status: AC
  Filled 2017-06-18: qty 2

## 2017-06-18 MED ORDER — ACETAMINOPHEN 10 MG/ML IV SOLN
INTRAVENOUS | Status: DC | PRN
  Administered 2017-06-18: 1000 mg via INTRAVENOUS

## 2017-06-18 MED ORDER — ONDANSETRON HCL 4 MG/2ML IJ SOLN
INTRAMUSCULAR | Status: DC | PRN
  Administered 2017-06-18: 4 mg via INTRAVENOUS

## 2017-06-18 MED ORDER — HYDROCODONE-ACETAMINOPHEN 5-325 MG PO TABS: 1.0000 | ORAL_TABLET | ORAL | 0 refills | Status: DC | PRN

## 2017-06-18 MED ORDER — LACTATED RINGERS IV SOLN
INTRAVENOUS | Status: DC
  Administered 2017-06-18: 12:00:00 via INTRAVENOUS

## 2017-06-18 MED ORDER — HYDROMORPHONE HCL 1 MG/ML IJ SOLN
INTRAMUSCULAR | Status: AC
  Administered 2017-06-18: 0.5 mg via INTRAVENOUS
  Filled 2017-06-18: qty 1

## 2017-06-18 MED ORDER — ACETAMINOPHEN 160 MG/5ML PO SOLN
325.0000 mg | ORAL | Status: DC | PRN
  Filled 2017-06-18: qty 20.3

## 2017-06-18 MED ORDER — PROMETHAZINE HCL 25 MG/ML IJ SOLN: 6.2500 mg | INTRAMUSCULAR | Status: DC | PRN

## 2017-06-18 MED ORDER — MIDAZOLAM HCL 2 MG/2ML IJ SOLN
INTRAMUSCULAR | Status: AC
Start: 2017-06-18 — End: ?
  Filled 2017-06-18: qty 2

## 2017-06-18 SURGICAL SUPPLY — 44 items
BINDER BREAST LRG (GAUZE/BANDAGES/DRESSINGS) IMPLANT
BINDER BREAST MEDIUM (GAUZE/BANDAGES/DRESSINGS) IMPLANT
BINDER BREAST XLRG (GAUZE/BANDAGES/DRESSINGS) IMPLANT
BINDER BREAST XXLRG (GAUZE/BANDAGES/DRESSINGS) IMPLANT
BLADE SURG 15 STRL SS SAFETY (BLADE) ×4 IMPLANT
CANISTER SUCT 1200ML W/VALVE (MISCELLANEOUS) ×2 IMPLANT
CHLORAPREP W/TINT 26ML (MISCELLANEOUS) ×2 IMPLANT
CNTNR SPEC 2.5X3XGRAD LEK (MISCELLANEOUS) ×1
CONT SPEC 4OZ STER OR WHT (MISCELLANEOUS) ×1
CONT SPEC 4OZ STRL OR WHT (MISCELLANEOUS) ×1
CONTAINER SPEC 2.5X3XGRAD LEK (MISCELLANEOUS) ×1 IMPLANT
COVER PROBE FLX POLY STRL (MISCELLANEOUS) ×2 IMPLANT
DEVICE DUBIN SPECIMEN MAMMOGRA (MISCELLANEOUS) ×2 IMPLANT
DRAPE LAPAROTOMY 100X77 ABD (DRAPES) ×2 IMPLANT
DRSG GAUZE FLUFF 36X18 (GAUZE/BANDAGES/DRESSINGS) ×2 IMPLANT
DRSG TELFA 4X3 1S NADH ST (GAUZE/BANDAGES/DRESSINGS) ×2 IMPLANT
ELECT CAUTERY BLADE TIP 2.5 (TIP) ×2
ELECT REM PT RETURN 9FT ADLT (ELECTROSURGICAL) ×2
ELECTRODE CAUTERY BLDE TIP 2.5 (TIP) ×1 IMPLANT
ELECTRODE REM PT RTRN 9FT ADLT (ELECTROSURGICAL) ×1 IMPLANT
GLOVE BIO SURGEON STRL SZ7.5 (GLOVE) ×2 IMPLANT
GLOVE INDICATOR 8.0 STRL GRN (GLOVE) ×2 IMPLANT
GOWN STRL REUS W/ TWL LRG LVL3 (GOWN DISPOSABLE) ×2 IMPLANT
GOWN STRL REUS W/TWL LRG LVL3 (GOWN DISPOSABLE) ×4
KIT TURNOVER KIT A (KITS) ×2 IMPLANT
LABEL OR SOLS (LABEL) ×2 IMPLANT
MARGIN MAP 10MM (MISCELLANEOUS) ×2 IMPLANT
NDL HYPO 25X1 1.5 SAFETY (NEEDLE) ×1 IMPLANT
NEEDLE HYPO 22GX1.5 SAFETY (NEEDLE) ×2 IMPLANT
NEEDLE HYPO 25X1 1.5 SAFETY (NEEDLE) ×2 IMPLANT
PACK BASIN MINOR ARMC (MISCELLANEOUS) ×2 IMPLANT
RETRACTOR RING XSMALL (MISCELLANEOUS) ×1 IMPLANT
RTRCTR WOUND ALEXIS 13CM XS SH (MISCELLANEOUS) ×2
SHEARS HARMONIC 9CM CVD (BLADE) ×2 IMPLANT
STRIP CLOSURE SKIN 1/2X4 (GAUZE/BANDAGES/DRESSINGS) ×2 IMPLANT
SUT ETHILON 3-0 FS-10 30 BLK (SUTURE) ×2
SUT VIC AB 2-0 CT1 27 (SUTURE) ×4
SUT VIC AB 2-0 CT1 TAPERPNT 27 (SUTURE) ×2 IMPLANT
SUT VIC AB 4-0 FS2 27 (SUTURE) ×2 IMPLANT
SUTURE EHLN 3-0 FS-10 30 BLK (SUTURE) ×1 IMPLANT
SWABSTK COMLB BENZOIN TINCTURE (MISCELLANEOUS) ×2 IMPLANT
SYR 10ML LL (SYRINGE) ×2 IMPLANT
TAPE TRANSPORE STRL 2 31045 (GAUZE/BANDAGES/DRESSINGS) ×2 IMPLANT
WATER STERILE IRR 1000ML POUR (IV SOLUTION) ×2 IMPLANT

## 2017-06-18 NOTE — Transfer of Care (Signed)
Immediate Anesthesia Transfer of Care Note  Patient: Tammy Schroeder  Procedure(s) Performed: BREAST BIOPSY (Right )  Patient Location: PACU  Anesthesia Type:General  Level of Consciousness: awake, alert , oriented and patient cooperative  Airway & Oxygen Therapy: Patient Spontanous Breathing  Post-op Assessment: Report given to RN, Post -op Vital signs reviewed and stable and Patient moving all extremities  Post vital signs: Reviewed and stable  Last Vitals:  Vitals Value Taken Time  BP 125/68 06/18/2017  3:10 PM  Temp 36.4 C 06/18/2017  3:10 PM  Pulse 108 06/18/2017  3:16 PM  Resp 13 06/18/2017  3:16 PM  SpO2 98 % 06/18/2017  3:16 PM  Vitals shown include unvalidated device data.  Last Pain:  Vitals:   06/18/17 1510  TempSrc:   PainSc: 0-No pain         Complications: No apparent anesthesia complications

## 2017-06-18 NOTE — Anesthesia Post-op Follow-up Note (Signed)
Anesthesia QCDR form completed.        

## 2017-06-18 NOTE — Anesthesia Procedure Notes (Signed)
Procedure Name: LMA Insertion Date/Time: 06/18/2017 2:20 PM Performed by: Lowry Bowl, CRNA Pre-anesthesia Checklist: Patient identified, Emergency Drugs available, Suction available and Patient being monitored Patient Re-evaluated:Patient Re-evaluated prior to induction Oxygen Delivery Method: Circle system utilized Preoxygenation: Pre-oxygenation with 100% oxygen Induction Type: IV induction Ventilation: Mask ventilation without difficulty LMA: LMA inserted LMA Size: 4.0 Number of attempts: 1 Placement Confirmation: positive ETCO2 and breath sounds checked- equal and bilateral Tube secured with: Tape Dental Injury: Teeth and Oropharynx as per pre-operative assessment

## 2017-06-18 NOTE — Anesthesia Preprocedure Evaluation (Signed)
Anesthesia Evaluation  Patient identified by MRN, date of birth, ID band Patient awake    Reviewed: Allergy & Precautions, H&P , NPO status , reviewed documented beta blocker date and time   Airway Mallampati: II  TM Distance: >3 FB Neck ROM: full    Dental no notable dental hx. (+) Teeth Intact   Pulmonary former smoker,    Pulmonary exam normal        Cardiovascular Normal cardiovascular exam     Neuro/Psych Seizures -,  PSYCHIATRIC DISORDERS Depression    GI/Hepatic hiatal hernia, GERD  Medicated and Controlled,  Endo/Other    Renal/GU      Musculoskeletal   Abdominal   Peds  Hematology   Anesthesia Other Findings Past Medical History: No date: Cervical cancer [facility])     Comment:  age 52 No date: Depression 05-04-13: Genetic testing     Comment:  negative colaris testing No date: GERD (gastroesophageal reflux disease) No date: H. pylori infection No date: History of hiatal hernia No date: Neuropathy 2007: Seizures ([place])     Comment:  1 seizure from being taken off of a migraine medication               abruptly 2013, 2014: Skin cancer Past Surgical History: age 23: ABDOMINAL HYSTERECTOMY No date: APPENDECTOMY 2011: BREAST BIOPSY; Left     Comment:  bx/clip-Vacuum biopsy 2:00 position, benign breast               tissue with fibrocystic and fibroadenoma-like changes. 06/08/2017: BREAST BIOPSY; Right     Comment:  affirm bx x clip    path pending No date: BREAST CYST ASPIRATION; Right     Comment:  neg 10/07/2009: BREAST SURGERY; Left     Comment:  Vacuum biopsy 2:00 position, benign breast tissue with               fibrocystic and fibroadenoma-like changes. May 20, 2004: CHOLECYSTECTOMY     Comment:  Chronic cholecystitis and cholelithiasis 2006: COLONOSCOPY 08/22/2014: COLONOSCOPY; N/A     Comment:  Procedure: COLONOSCOPY;  Surgeon: Robert Bellow, MD;              Location: [facility] ENDOSCOPY;   Service: Endoscopy;                Laterality: N/A; 08/29/2009: COLONOSCOPY W/ POLYPECTOMY; N/A     Comment:  Tubular adenoma from the ascending colon. Gaylyn Cheers, M.D. 2007: LASIK 2005: OOPHORECTOMY BMI    Body Mass Index:  31.05 kg/m     Reproductive/Obstetrics                             Anesthesia Physical Anesthesia Plan  ASA: II  Anesthesia Plan: General LMA   Post-op Pain Management:    Induction:   PONV Risk Score and Plan: 3 and Ondansetron, Treatment may vary due to age or medical condition, Midazolam and Metaclopromide  Airway Management Planned:   Additional Equipment:   Intra-op Plan:   Post-operative Plan:   Informed Consent: I have reviewed the patients History and Physical, chart, labs and discussed the procedure including the risks, benefits and alternatives for the proposed anesthesia with the patient or authorized representative who has indicated his/her understanding and acceptance.   Dental Advisory Given  Plan Discussed with: CRNA  Anesthesia Plan Comments:  Anesthesia Quick Evaluation  

## 2017-06-18 NOTE — Discharge Instructions (Signed)

## 2017-06-18 NOTE — H&P (Signed)
Patient has undergone a right breast biopsy with findings of a radial scar. For excision. No change in clinical history or exam.  Office u/s completed 3 days ago shows the biopsy cavity well identified at the 7: 30 o'clock position four cm from the nipple.

## 2017-06-18 NOTE — Op Note (Signed)
Preoperative diagnosis: Radial scar right breast.  Postoperative diagnosis: Same  Operative procedure: Excision radial scar right breast with ultrasound guidance.  Operative procedure Surgeon Hervey Ard, MD.  Anesthesia: General by LMA, Marcaine 0.5% with 1 to 200,000 units of epinephrine, 30 cc.  Estimated blood loss: 5 cc.  Clinical note: This 52 year old woman underwent screening mammography and there is a questionable density.  Subsequent biopsy by the radiology service showed an area of radial scar.  Given her options for management of observation versus excision she decided to proceed to excision.  Operative note: With the patient under adequate general anesthesia the area of the breast chest and axilla was cleansed with ChloraPrep and draped.  Ultrasound was used to identify the previous biopsy cavity.  A radial incision was made from the base of the areola to the inframammary fold.  This was completed after installation of local anesthesia.  Skin was incised sharply and remaining dissection completed with electrocautery.  The adipose tissue was elevated off the breast parenchyma.  The biopsy cavity was entered on the anterior cephalad aspect and this was closed with a 2-0 Vicryl figure-of-eight suture.  A 2 1/2 by 2-1/2 x 4 cm block of tissue was excised, orientated and sent for specimen radiograph.  This confirmed the previously placed clip had been removed.  The breast parenchyma was elevated off the underlying pectoralis fascia.  This was then approximated with interrupted 2-0 Vicryl figure-of-eight sutures.  The adipose layer was approximated a similar fashion.  Interrupted 2-0 Vicryl deep dermal sutures were placed followed by a running 4-0 Vicryl subcuticular suture.  Benzoin, Steri-Strips, Telfa dressing applied.  Fluff gauze and a compressive wrap placed.  The patient tolerated the procedure well was taken to recovery room in stable condition.

## 2017-06-21 ENCOUNTER — Encounter: Payer: Self-pay | Admitting: General Surgery

## 2017-06-21 ENCOUNTER — Telehealth: Payer: Self-pay | Admitting: *Deleted

## 2017-06-21 LAB — SURGICAL PATHOLOGY

## 2017-06-21 NOTE — Telephone Encounter (Signed)
1-2 weeks.

## 2017-06-21 NOTE — Telephone Encounter (Signed)
Patient called to make a post op appointment but was unsure when she needs to come back. When do you want to see the patient in the office?

## 2017-06-21 NOTE — Anesthesia Postprocedure Evaluation (Signed)
Anesthesia Post Note  Patient: Tammy Schroeder  Procedure(s) Performed: BREAST BIOPSY (Right )  Patient location during evaluation: PACU Anesthesia Type: General Level of consciousness: awake and alert Pain management: pain level controlled Vital Signs Assessment: post-procedure vital signs reviewed and stable Respiratory status: spontaneous breathing, nonlabored ventilation, respiratory function stable and patient connected to nasal cannula oxygen Cardiovascular status: blood pressure returned to baseline and stable Postop Assessment: no apparent nausea or vomiting Anesthetic complications: no     Last Vitals:  Vitals:   06/18/17 1551 06/18/17 1617  BP: 122/67 127/71  Pulse: 92 87  Resp: 18 18  Temp: (!) 36.2 C   SpO2: 99% 97%    Last Pain:  Vitals:   06/21/17 0820  TempSrc:   PainSc: 0-No pain                 Martha Clan

## 2017-06-29 ENCOUNTER — Ambulatory Visit (INDEPENDENT_AMBULATORY_CARE_PROVIDER_SITE_OTHER): Payer: BLUE CROSS/BLUE SHIELD | Admitting: General Surgery

## 2017-06-29 ENCOUNTER — Encounter: Payer: Self-pay | Admitting: General Surgery

## 2017-06-29 VITALS — BP 110/78 | HR 78 | Resp 14 | Ht 65.0 in | Wt 188.0 lb

## 2017-06-29 DIAGNOSIS — N6489 Other specified disorders of breast: Secondary | ICD-10-CM

## 2017-06-29 NOTE — Progress Notes (Signed)
Patient ID: Tammy Schroeder, female   DOB: Mar 06, 1965, 52 y.o.   MRN: [id number]  Chief Complaint  Patient presents with  . Routine Post Op    HPI Tammy Schroeder is a 52 y.o. female here for a post op visit from right breast excisional biopsy done on 06/18/17. She is doing well just some soreness. She has not needed prescription pain medication.  HPI  Past Medical History:  Diagnosis Date  . Cervical cancer [facility])    age 61  . Depression   . Genetic testing 05-04-13   negative colaris testing  . GERD (gastroesophageal reflux disease)   . H. pylori infection   . History of hiatal hernia   . Neuropathy   . Seizures ([place]) 2007   1 seizure from being taken off of a migraine medication abruptly  . Skin cancer 2013, 2014    Past Surgical History:  Procedure Laterality Date  . ABDOMINAL HYSTERECTOMY  age 63  . APPENDECTOMY    . BREAST BIOPSY Left 2011   bx/clip-Vacuum biopsy 2:00 position, benign breast tissue with fibrocystic and fibroadenoma-like changes.  Marland Kitchen BREAST BIOPSY Right 06/08/2017   affirm bx x clip    path pending  . BREAST BIOPSY Right 06/18/2017   Procedure: BREAST BIOPSY;  Surgeon: Tammy Bellow, MD;  Location: [facility] ORS;  Service: General;  Laterality: Right;  . BREAST CYST ASPIRATION Right    neg  . BREAST SURGERY Left 10/07/2009   Vacuum biopsy 2:00 position, benign breast tissue with fibrocystic and fibroadenoma-like changes.  . CHOLECYSTECTOMY  May 20, 2004   Chronic cholecystitis and cholelithiasis  . COLONOSCOPY  2006  . COLONOSCOPY N/A 08/22/2014   Procedure: COLONOSCOPY;  Surgeon: Tammy Bellow, MD;  Location: [facility] ENDOSCOPY;  Service: Endoscopy;  Laterality: N/A;  . COLONOSCOPY W/ POLYPECTOMY N/A 08/29/2009   Tubular adenoma from the ascending colon. Tammy Schroeder, M.D.  . LASIK  2007  . OOPHORECTOMY  2005    Family History  Problem Relation Age of Onset  . CVA Mother 20  . Breast cancer Mother 14       DCIS, Uterine cancer  . Colon cancer  Brother        Age 50, age 27  . Prostate cancer Father   . Lung cancer Father 32       skin cancer  . Uterine cancer Maternal Grandmother     Social History Social History   Tobacco Use  . Smoking status: Former Smoker    Packs/day: 1.00    Years: 29.00    Pack years: 29.00    Types: Cigarettes    Last attempt to quit: 04/02/2007    Years since quitting: 10.2  . Smokeless tobacco: Never Used  Substance Use Topics  . Alcohol use: Yes    Comment: rarely  . Drug use: No    Allergies  Allergen Reactions  . Sulfa Antibiotics Itching    Current Outpatient Medications  Medication Sig Dispense Refill  . amitriptyline (ELAVIL) 25 MG tablet Take 25 mg by mouth at bedtime.   11  . atorvastatin (LIPITOR) 10 MG tablet Take 10 mg by mouth daily.    Marland Kitchen BIOTIN PO Take 1 tablet by mouth daily.    . citalopram (CELEXA) 20 MG tablet Take 10 mg by mouth daily.     Marland Kitchen CRANBERRY PO Take 2 tablets by mouth daily.    Marland Kitchen docusate sodium (CORRECTOL EXTRA GENTLE) 100 MG capsule Take 2 capsules by mouth daily  as needed (constipation).     Marland Kitchen ELIDEL 1 % cream Apply 1 application topically daily.  0  . estradiol (ESTRACE) 2 MG tablet Take 2 mg by mouth daily.     Marland Kitchen gabapentin (NEURONTIN) 100 MG capsule Take 300 mg by mouth 3 (three) times daily.     . meloxicam (MOBIC) 15 MG tablet Take 15 mg by mouth daily.    . pantoprazole (PROTONIX) 40 MG tablet Take 40 mg by mouth daily.    . SUMAtriptan (IMITREX) 100 MG tablet Take 1 tablet by mouth every 2 (two) hours as needed for migraine.   5  . traZODone (DESYREL) 150 MG tablet Take 150 mg by mouth at bedtime.      No current facility-administered medications for this visit.     Review of Systems Review of Systems  Constitutional: Negative.   Respiratory: Negative.   Cardiovascular: Negative.     Blood pressure 110/78, pulse 78, resp. rate 14, height 5\' 5"  (1.651 m), weight 188 lb (85.3 kg).  Physical Exam Physical Exam  Constitutional: She is  oriented to person, place, and time. She appears well-developed and well-nourished.  Neurological: She is alert and oriented to person, place, and time.  Skin: Skin is warm and dry.  Psychiatric: She has a normal mood and affect.  Breast incision: Healing well.  Data Reviewed DIAGNOSIS:  A. RIGHT BREAST, LOWER OUTER QUADRANT; WIDE EXCISION WITH ULTRASOUND  GUIDANCE:  - COMPLEX SCLEROSING LESION WITH CALCIFICATIONS.  - BIOPSY SITE CHANGES, MARKER CLIP PRESENT.  - BIOPSY SITE CHANGES AND LESIONAL TISSUE PRESENT AT THE ANTERIOR  MARGIN.  - NEGATIVE FOR ATYPIA AND MALIGNANCY.   Note: The complex sclerosing lesion measures 15 mm in this material.   Assessment    Doing well status post formal excision of radial scar from the right breast.    Plan    Follow up as needed annual screening mammograms with PCP.     HPI, Physical Exam, Assessment and Plan have been scribed under the direction and in the presence of Tammy Bellow, MD  Tammy Living, LPN  I have completed the exam and reviewed the above documentation for accuracy and completeness.  I agree with the above.  Haematologist has been used and any errors in dictation or transcription are unintentional.  Tammy Schroeder, M.D., F.A.C.S.   Tammy Schroeder 06/29/2017, 6:57 PM

## 2017-06-29 NOTE — Patient Instructions (Addendum)
Remove steri strips when loose. Follow up as needed

## 2017-08-16 ENCOUNTER — Other Ambulatory Visit: Payer: Self-pay | Admitting: Student

## 2017-08-16 DIAGNOSIS — K2970 Gastritis, unspecified, without bleeding: Secondary | ICD-10-CM

## 2017-08-16 DIAGNOSIS — K219 Gastro-esophageal reflux disease without esophagitis: Secondary | ICD-10-CM

## 2017-08-16 DIAGNOSIS — R1310 Dysphagia, unspecified: Secondary | ICD-10-CM

## 2017-08-20 ENCOUNTER — Telehealth: Payer: Self-pay | Admitting: General Surgery

## 2017-08-20 ENCOUNTER — Ambulatory Visit
Admission: RE | Admit: 2017-08-20 | Discharge: 2017-08-20 | Disposition: A | Discharge status: Home / Self Care | Payer: BLUE CROSS/BLUE SHIELD | Source: Ambulatory Visit | Attending: Student | Admitting: Student

## 2017-08-20 DIAGNOSIS — K219 Gastro-esophageal reflux disease without esophagitis: Secondary | ICD-10-CM | POA: Insufficient documentation

## 2017-08-20 DIAGNOSIS — R1310 Dysphagia, unspecified: Secondary | ICD-10-CM | POA: Insufficient documentation

## 2017-08-20 DIAGNOSIS — K2970 Gastritis, unspecified, without bleeding: Secondary | ICD-10-CM | POA: Diagnosis present

## 2017-08-20 NOTE — Telephone Encounter (Signed)
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  The patient confirms that she had underwent bilateral ovary removal in 2005.  This brings her risk back to baseline.  Patient has a daughter and raised the question of whether she should be tested.  This is probably reasonable.  Ideally this can be completed through her gynecologist.  Her daughter, Tammy Schroeder was seen in this office in 2000 16/2017 for hemorrhoids.  She may return here to discuss if she desires.

## 2017-10-09 ENCOUNTER — Other Ambulatory Visit: Payer: Self-pay

## 2017-10-09 ENCOUNTER — Ambulatory Visit
Admission: EM | Admit: 2017-10-09 | Discharge: 2017-10-09 | Disposition: A | Discharge status: Home / Self Care | Payer: BLUE CROSS/BLUE SHIELD | Attending: Emergency Medicine | Admitting: Emergency Medicine

## 2017-10-09 DIAGNOSIS — J069 Acute upper respiratory infection, unspecified: Secondary | ICD-10-CM | POA: Diagnosis not present

## 2017-10-09 MED ORDER — HYDROCOD POLST-CPM POLST ER 10-8 MG/5ML PO SUER: 5.0000 mL | Freq: Two times a day (BID) | ORAL | 0 refills | Status: AC

## 2017-10-09 MED ORDER — BENZONATATE 200 MG PO CAPS: ORAL_CAPSULE | ORAL | 0 refills | Status: AC

## 2017-10-09 NOTE — ED Triage Notes (Signed)
Pt c/o sinus and chest congestion with HA for the past 3-4 days. States she has been taking OTC decongestants without relief. States it seemed to get worse over night

## 2017-10-09 NOTE — ED Provider Notes (Signed)
[facility]    CSN: [id number] Arrival date & time: 10/09/17  1219     History   Chief Complaint Chief Complaint  Patient presents with  . Nasal Congestion    HPI Tammy Schroeder is a 52 y.o. female.   HPI  52 year old female presents with sinus and chest congestion along with headache that she is had for 3 to 4 days.  Been taking over-the-counter decongestants but without relief.  Is that it seems to be getting worse at nighttime.  She has had no fever or chills.  Quit smoking approximately 10 years ago.  The cough has been productive of a greenish type sputum.         Past Medical History:  Diagnosis Date  . Cervical cancer [facility])    age 58  . Depression   . Genetic testing 05-04-13   negative colaris testing  . GERD (gastroesophageal reflux disease)   . H. pylori infection   . History of hiatal hernia   . Neuropathy   . Seizures ([place]) 2007   1 seizure from being taken off of a migraine medication abruptly  . Skin cancer 2013, 2014    Patient Active Problem List   Diagnosis Date Noted  . Right, 15 mm, no atypia 06/29/2017  . Chronic constipation 05/07/2014  . GERD (gastroesophageal reflux disease) 08/15/2012    Past Surgical History:  Procedure Laterality Date  . ABDOMINAL HYSTERECTOMY  age 69  . APPENDECTOMY    . BREAST BIOPSY Left 2011   bx/clip-Vacuum biopsy 2:00 position, benign breast tissue with fibrocystic and fibroadenoma-like changes.  Marland Kitchen BREAST BIOPSY Right 06/08/2017   affirm bx x clip    path pending  . BREAST BIOPSY Right 06/18/2017   Procedure: BREAST BIOPSY;  Surgeon: Robert Bellow, MD;  Location: [facility] ORS;  Service: General;  Laterality: Right;  . BREAST CYST ASPIRATION Right    neg  . BREAST SURGERY Left 10/07/2009   Vacuum biopsy 2:00 position, benign breast tissue with fibrocystic and fibroadenoma-like changes.  . CHOLECYSTECTOMY  May 20, 2004   Chronic cholecystitis and cholelithiasis  . COLONOSCOPY  2006  .  COLONOSCOPY N/A 08/22/2014   Procedure: COLONOSCOPY;  Surgeon: Robert Bellow, MD;  Location: [facility] ENDOSCOPY;  Service: Endoscopy;  Laterality: N/A;  . COLONOSCOPY W/ POLYPECTOMY N/A 08/29/2009   Tubular adenoma from the ascending colon. Gaylyn Cheers, M.D.  . LASIK  2007  . OOPHORECTOMY  2005    OB History    Gravida  2   Para  2   Term      Preterm      AB      Living  2     SAB      TAB      Ectopic      Multiple      Live Births           Obstetric Comments  1st Menstrual Cycle: 11  1st Pregnancy:  18         Home Medications    Prior to Admission medications   Medication Sig Start Date End Date Taking? Authorizing Provider  amitriptyline (ELAVIL) 25 MG tablet Take 25 mg by mouth at bedtime.  05/28/14  Yes [provider]  atorvastatin (LIPITOR) 10 MG tablet Take 10 mg by mouth daily.   Yes [provider]  BIOTIN PO Take 1 tablet by mouth daily.   Yes [provider]  citalopram (CELEXA) 20 MG tablet Take  10 mg by mouth daily.    Yes [provider]  docusate sodium (CORRECTOL EXTRA GENTLE) 100 MG capsule Take 2 capsules by mouth daily as needed (constipation).    Yes [provider]  estradiol (ESTRACE) 2 MG tablet Take 2 mg by mouth daily.    Yes [provider]  gabapentin (NEURONTIN) 100 MG capsule Take 300 mg by mouth 3 (three) times daily.    Yes [provider]  meloxicam (MOBIC) 15 MG tablet Take 15 mg by mouth daily.   Yes [provider]  pantoprazole (PROTONIX) 40 MG tablet Take 40 mg by mouth daily.   Yes [provider]  SUMAtriptan (IMITREX) 100 MG tablet Take 1 tablet by mouth every 2 (two) hours as needed for migraine.  05/25/14  Yes [provider]  traZODone (DESYREL) 150 MG tablet Take 150 mg by mouth at bedtime.    Yes [provider]  benzonatate (TESSALON) 200 MG capsule Take one cap TID PRN cough 10/09/17   Lorin Picket, PA-C    chlorpheniramine-HYDROcodone [facility]) 10-8 MG/5ML SUER Take 5 mLs by mouth 2 (two) times daily. 10/09/17   Lorin Picket, PA-C  CRANBERRY PO Take 2 tablets by mouth daily.    [provider]    Family History Family History  Problem Relation Age of Onset  . CVA Mother 41  . Breast cancer Mother 13       DCIS, Uterine cancer  . Colon cancer Brother        Age 52, age 25  . Prostate cancer Father   . Lung cancer Father 68       skin cancer  . Uterine cancer Maternal Grandmother     Social History Social History   Tobacco Use  . Smoking status: Former Smoker    Packs/day: 1.00    Years: 29.00    Pack years: 29.00    Types: Cigarettes    Last attempt to quit: 04/02/2007    Years since quitting: 10.5  . Smokeless tobacco: Never Used  Substance Use Topics  . Alcohol use: Yes    Comment: rarely  . Drug use: No     Allergies   Sulfa antibiotics   Review of Systems Review of Systems  Constitutional: Positive for activity change. Negative for appetite change, chills, diaphoresis, fatigue and fever.  HENT: Positive for congestion, postnasal drip, sinus pressure and sinus pain.   Respiratory: Positive for cough.   All other systems reviewed and are negative.    Physical Exam Triage Vital Signs ED Triage Vitals  Enc Vitals Group     BP 10/09/17 1228 122/74     Pulse Rate 10/09/17 1228 66     Resp 10/09/17 1228 17     Temp 10/09/17 1228 98 F (36.7 C)     Temp Source 10/09/17 1228 Oral     SpO2 10/09/17 1228 98 %     Weight 10/09/17 1229 180 lb (81.6 kg)     Height 10/09/17 1229 5\' 5"  (1.651 m)     Head Circumference --      Peak Flow --      Pain Score 10/09/17 1229 4     Pain Loc --      Pain Edu? --      Excl. in [place]? --    No data found.  Updated Vital Signs BP 122/74 (BP Location: Right Arm)   Pulse 66   Temp 98 F (36.7 C) (  Oral)   Resp 17   Ht 5\' 5"  (1.651 m)   Wt 180 lb (81.6 kg)   SpO2 98%   BMI 29.95 kg/m    Visual Acuity Right Eye Distance:   Left Eye Distance:   Bilateral Distance:    Right Eye Near:   Left Eye Near:    Bilateral Near:     Physical Exam  Constitutional: She is oriented to person, place, and time. She appears well-developed and well-nourished. No distress.  HENT:  Head: Normocephalic.  Right Ear: External ear normal.  Left Ear: External ear normal.  Nose: Nose normal.  Mouth/Throat: Oropharynx is clear and moist. No oropharyngeal exudate.  Patient has mild tenderness to percussion of the frontal maxillary sinuses  Eyes: Pupils are equal, round, and reactive to light. Right eye exhibits no discharge. Left eye exhibits no discharge.  Neck: Normal range of motion.  Pulmonary/Chest: Effort normal and breath sounds normal.  Musculoskeletal: Normal range of motion.  Lymphadenopathy:    She has no cervical adenopathy.  Neurological: She is alert and oriented to person, place, and time.  Skin: Skin is warm and dry. She is not diaphoretic.  Psychiatric: She has a normal mood and affect. Her behavior is normal. Judgment and thought content normal.  Nursing note and vitals reviewed.    UC Treatments / Results  Labs (all labs ordered are listed, but only abnormal results are displayed) Labs Reviewed - No data to display  EKG None  Radiology No results found.  Procedures Procedures (including critical care time)  Medications Ordered in UC Medications - No data to display  Initial Impression / Assessment and Plan / UC Course  I have reviewed the triage vital signs and the nursing notes.  Pertinent labs & imaging results that were available during my care of the patient were reviewed by me and considered in my medical decision making (see chart for details).     I have told the patient this is likely a viral illness does not require antibiotics at this time.  Recommended the use of Flonase nasal spray on a daily basis for 2 to 3 weeks to promote drainage.   Will provide her with a cough suppressants.  Runs high fevers or if this persists for another 7 to 10 days she may return to our clinic for reevaluation Final Clinical Impressions(s) / UC Diagnoses   Final diagnoses:  Upper respiratory tract infection, unspecified type   Discharge Instructions   None    ED Prescriptions    Medication Sig Dispense Auth. Provider   benzonatate (TESSALON) 200 MG capsule Take one cap TID PRN cough 30 capsule Lorin Picket, PA-C   chlorpheniramine-HYDROcodone (TUSSIONEX PENNKINETIC ER) 10-8 MG/5ML SUER Take 5 mLs by mouth 2 (two) times daily. 115 mL Lorin Picket, PA-C     Controlled Substance Prescriptions [place] Controlled Substance Registry consulted? Not Applicable   Lorin Picket, PA-C 10/09/17 1428

## 2017-10-13 ENCOUNTER — Ambulatory Visit
Admission: RE | Admit: 2017-10-13 | Discharge: 2017-10-13 | Disposition: A | Discharge status: Home / Self Care | Payer: BLUE CROSS/BLUE SHIELD | Source: Ambulatory Visit | Attending: Family Medicine | Admitting: Family Medicine

## 2017-10-13 ENCOUNTER — Other Ambulatory Visit: Payer: Self-pay | Admitting: Family Medicine

## 2017-10-13 DIAGNOSIS — R05 Cough: Secondary | ICD-10-CM

## 2017-10-13 DIAGNOSIS — R059 Cough, unspecified: Secondary | ICD-10-CM

## 2017-11-02 ENCOUNTER — Encounter: Payer: Self-pay | Admitting: *Deleted

## 2018-03-18 ENCOUNTER — Other Ambulatory Visit: Payer: Self-pay | Admitting: General Surgery

## 2018-03-18 DIAGNOSIS — Z1231 Encounter for screening mammogram for malignant neoplasm of breast: Secondary | ICD-10-CM

## 2018-05-23 ENCOUNTER — Ambulatory Visit: Payer: BLUE CROSS/BLUE SHIELD

## 2018-07-14 ENCOUNTER — Ambulatory Visit
Admission: RE | Admit: 2018-07-14 | Discharge: 2018-07-14 | Disposition: A | Discharge status: Home / Self Care | Payer: BC Managed Care – PPO | Source: Ambulatory Visit | Attending: General Surgery | Admitting: General Surgery

## 2018-07-14 ENCOUNTER — Other Ambulatory Visit: Payer: Self-pay

## 2018-07-14 DIAGNOSIS — Z1231 Encounter for screening mammogram for malignant neoplasm of breast: Secondary | ICD-10-CM | POA: Insufficient documentation

## 2018-08-15 ENCOUNTER — Encounter: Payer: Self-pay | Admitting: General Surgery

## 2019-02-02 ENCOUNTER — Other Ambulatory Visit: Payer: Self-pay | Admitting: Specialist

## 2019-02-02 DIAGNOSIS — R918 Other nonspecific abnormal finding of lung field: Secondary | ICD-10-CM

## 2019-04-07 ENCOUNTER — Other Ambulatory Visit: Payer: Self-pay | Admitting: Obstetrics & Gynecology

## 2019-04-07 DIAGNOSIS — Z1231 Encounter for screening mammogram for malignant neoplasm of breast: Secondary | ICD-10-CM

## 2019-09-25 ENCOUNTER — Other Ambulatory Visit: Payer: Self-pay

## 2019-09-25 ENCOUNTER — Other Ambulatory Visit: Payer: BC Managed Care – PPO

## 2019-09-25 DIAGNOSIS — Z20822 Contact with and (suspected) exposure to covid-19: Secondary | ICD-10-CM

## 2019-09-26 LAB — SARS-COV-2, NAA 2 DAY TAT

## 2019-09-26 LAB — NOVEL CORONAVIRUS, NAA: SARS-CoV-2, NAA: NOT DETECTED

## 2020-01-18 ENCOUNTER — Ambulatory Visit
Admission: RE | Admit: 2020-01-18 | Discharge: 2020-01-18 | Disposition: A | Discharge status: Home / Self Care | Payer: BC Managed Care – PPO | Source: Ambulatory Visit | Attending: Specialist | Admitting: Specialist

## 2020-01-18 ENCOUNTER — Other Ambulatory Visit: Payer: Self-pay

## 2020-01-18 DIAGNOSIS — R918 Other nonspecific abnormal finding of lung field: Secondary | ICD-10-CM | POA: Diagnosis not present

## 2020-01-24 ENCOUNTER — Other Ambulatory Visit: Payer: Self-pay | Admitting: General Surgery

## 2020-01-24 NOTE — Progress Notes (Signed)
Subjective:     Patient ID: Tammy Schroeder is a 55 y.o. female.  HPI  The following portions of the patient's history were reviewed and updated as appropriate.  This an established patient is here today for: office visit. The patient is here today with complaints of a lump in her right breast near her incision site. She reports it has been there for about one month. Patient states it is the size of a pencil eraser. It does not hurt. She denies any trauma to the breast. Patient states her last mammogram was done in September and reports it was normal.       Chief Complaint  Patient presents with  . Follow-up    c/o lump right breast near incision     BP 112/82   Pulse 90   Temp 36.3 C (97.4 F)   Ht 167.6 cm ($RemoveB'5\' 6"'ONeBltIN$ )   Wt 79.8 kg (176 lb)   SpO2 94%   BMI 28.41 kg/m       Past Medical History:  Diagnosis Date  . Adenomatous colon polyp, unspecified    Personal history of adenomatous colon polyps.  . Anxiety   . Arthritis    hands  . Basal cell carcinoma 2015   left shoulder  . Depression   . Dysplasia of cervix    History of dysplasia cells on cervix.  . Endometrial cancer (CMS-HCC)   . Esophageal ulcer    reported by patient upon ENT evaluation.  . Gastritis   . Genetic testing 05/04/2013  . GERD (gastroesophageal reflux disease) 2017  . H. pylori infection 2006   H.pylori treatment with Prevpac  . Helicobacter pylori gastritis 06/10/2017   Reported triple therapy in 2013 EGD with active infection 05/2017 - treated with bismuth-based quad therapy.  . Hiatal hernia   . Hyperlipidemia   . Insomnia   . Lung nodule 06/11/2015   24mm left upper lobe nodule [facility]. 06/03/2015- needs repeat 05/2016  . Migraine headache   . Neuropathy   . PONV (postoperative nausea and vomiting)   . Poor intravenous access   . Sciatica   . Seizures (CMS-HCC) 2007  . Skin cancer           Past Surgical History:  Procedure Laterality Date  .  APPENDECTOMY     at 55 years old.  . ARTHROSCOPY HIP Left 08/24/2019   Procedure: ARTHROSCOPY, HIP, DIAGNOSTIC WITH OR WITHOUT SYNOVIAL BIOPSY;  Surgeon: Haskel Schroeder, MD;  Location: [facility] OR;  Service: Orthopedics;  Laterality: Left;  . ASPIRATION CYST BREAST Right   . BREAST BIOPSY Left 2011   benign  . BREAST EXCISIONAL BIOPSY Right 06/18/2017  . BREAST EXCISIONAL BIOPSY Right 06/08/2017  . CHOLECYSTECTOMY  2005  . COLONOSCOPY  08/22/2014    (Dr. Bary Castilla) - normal colon; repeat in 08/2019 due to history of adenomatous colon polyps.  . COLONOSCOPY  2006  . ENDOSCOPIC REPAIR GLUTEUS MEDIUS Left 08/24/2019   Procedure: (RCC) Left ENDOSCOPIC REPAIR GLUTEUS MEDIUS 2;  Surgeon: Haskel Schroeder, MD;  Location: [facility] OR;  Service: Orthopedics;  Laterality: Left;  . ESOPHAGOGASTRODOUDENOSCOPY  06/09/2017   (Dr. Clydene Laming @ [facility]) - normal esophagus and Z-line, GE flap classified as Hill Grade II, gastritis in fundus and body, normal duodenum. (path: chronic, focally active gastritis, + H pylori).  . hand surgery    . HYSTERECTOMY  1997   w/ BSO for precancerous changes, benign reasons  . INSERTION WIRE/PIN W/SKELETAL TRACTION Right 10/17/2018  Procedure: R- INSERTION OF WIRE OR PIN WITH APPLICATION OF SKELETAL TRACTION, INCLUDING REMOVAL (SEPARATE PROCEDURE);  Surgeon: Darnelle Bos, MD;  Location: [facility] OR;  Service: Orthopedics;  Laterality: Right;  . INTRAOPERATIVE FLUOROSCOPY Right 10/17/2018   Procedure: R- FLUOROSCOPY (SEPARATE PROCEDURE), UP TO 1 HOUR PHYSICIAN OR OTHER QUALIFIED HEALTH CARE PROFESSIONAL TIME;  Surgeon: Klifto, Merwyn Katos, MD;  Location: [facility] OR;  Service: Orthopedics;  Laterality: Right;  . OOPHORECTOMY  2005  . REVISION WRIST ARTHROPLASTY Right 10/17/2018   Procedure: R- ARTHROPLASTY, INTERPOSITION, INTERCARPAL OR CARPOMETACARPAL JOINTS;  Surgeon: Darnelle Bos, MD;  Location: [facility] OR;  Service:  Orthopedics;  Laterality: Right;  . TUBAL LIGATION  1988              OB History    Gravida  3   Para  2   Term  2   Preterm      AB  1   Living  2     SAB  1   IAB      Ectopic      Molar      Multiple      Live Births          Obstetric Comments  Age at first period 51 Age of first pregnancy 77         Social History          Socioeconomic History  . Marital status: Married    Spouse name: Not on file  . Number of children: 2  . Years of education: Not on file  . Highest education level: Not on file  Occupational History  . Occupation: Education officer, [employer]  Tobacco Use  . Smoking status: Former Smoker    Packs/day: 1.00    Years: 29.00    Pack years: 29.00    Types: Cigarettes    Start date: 09/13/1978    Quit date: 04/08/2007    Years since quitting: 12.7  . Smokeless tobacco: Never Used  Vaping Use  . Vaping Use: Never used  Substance and Sexual Activity  . Alcohol use: Yes    Comment: socially  . Drug use: Never  . Sexual activity: Yes    Partners: Male    Birth control/protection: Post-menopausal, Surgical    Comment: husband  Other Topics Concern  . Would you please tell us about the people who live in your home, your pets, or anything else important to your social life? Not Asked  Social History Narrative   Married.  Husband is a former TEFL teacher, now retired.   Social Determinants of Health   Financial Resource Strain: Not on file  Food Insecurity: Not on file  Transportation Needs: Not on file            Allergies  Allergen Reactions  . Trazodone Other (See Comments)    GI upset  . Sulfa (Sulfonamide Antibiotics) Itching    Current Medications        Current Outpatient Medications  Medication Sig Dispense Refill  . albuterol 90 mcg/actuation inhaler Inhale 2 inhalations into the lungs every 4 (four) hours as needed for Wheezing 8.5 each 1  . amitriptyline (ELAVIL) 75 MG  tablet TAKE 1 TABLET BY MOUTH EVERY DAY AT NIGHT    . atorvastatin (LIPITOR) 10 MG tablet Take 1 tablet (10 mg total) by mouth once daily 90 tablet 3  . biotin 1 mg Cap Take by mouth.    . cranberry extract 200 mg capsule Take 200  mg by mouth once daily.      . diclofenac (VOLTAREN) 1 % topical gel APPLY 2 G TOPICALLY 4 (FOUR) TIMES DAILY 100 g 5  . docusate (COLACE) 100 MG capsule Take 100 mg by mouth once daily as needed.      Marland Kitchen estradioL (ESTRACE) 2 MG tablet Take 1 tablet (2 mg total) by mouth once daily 90 tablet 3  . estradiol (VIVELLE-DOT) patch 0.025 mg/24 hr Place 1 patch onto the skin twice a week 24 patch 1  . gabapentin (NEURONTIN) 300 MG capsule Take 1 capsule (300 mg total) by mouth nightly (Patient taking differently: Take 100 mg by mouth nightly   ) 90 capsule 1  . magnesium oxide (MAG-OX) 400 mg tablet Take 400 mg by mouth once daily.    . meloxicam (MOBIC) 15 MG tablet Take 1 tablet (15 mg total) by mouth once daily 90 tablet 0  . pantoprazole (PROTONIX) 40 MG DR tablet Take 1 tablet (40 mg total) by mouth once daily 90 tablet 2  . potassium chloride (K-DUR,KLOR-CON) 20 MEQ ER tablet Take 20 mEq by mouth once daily.    . SUMAtriptan (IMITREX) 100 MG tablet TAKE ONE TABLET BY MOUTH AS NEEDED. MAY TAKE A SECOND DOSE AFTER 2 HOURS IF NEEDED. 9 tablet 9  . zolpidem (AMBIEN) 10 mg tablet Take 1 tablet (10 mg total) by mouth nightly as needed for Sleep 30 tablet 5  . aspirin 81 MG EC tablet Take 1 tablet (81 mg total) by mouth 2 (two) times daily for 14 days 28 tablet 0  . lidocaine-diphenhydramine-aluminum-magnesium-simethicone (FIRST MOUTHWASH BLM) oral suspension Swish and spit 5 mLs every 6 (six) hours as needed (Patient not taking: Reported on 01/04/2020  ) 1 Bottle 0  . tiZANidine (ZANAFLEX) 4 MG tablet Take 1-2 tablets (4-8 mg total) by mouth every 6 (six) hours as needed for Muscle spasms (Patient not taking: Reported on 01/04/2020  ) 30 tablet 0   No current  facility-administered medications for this visit.           Family History  Problem Relation Age of Onset  . Diabetes type II Mother   . Coronary Artery Disease (Blocked arteries around heart) Mother   . Colon polyps Mother   . Breast cancer Mother   . Myocardial Infarction (Heart attack) Father   . Colon polyps Father   . Prostate cancer Father   . Lung cancer Father   . Colon cancer Brother 29       Brother diagnosed with colon cancer and metastatic disease  . Colon polyps Brother   . Colon cancer Brother 84       Brother diagnosed with colon cancer, metastatic age 61 with chemotherapy and radiation therapy.  . Colon polyps Brother   . Breast cancer Maternal Grandmother   . Uterine cancer Maternal Grandmother   . Colon polyps Daughter   . Breast cancer Other        paternal great aunt  . Anesthesia problems Neg Hx   . BRCA 1/2 Neg Hx       Review of Systems  Constitutional: Negative for chills and fever.  Respiratory: Negative for cough.        Objective:   Physical Exam Exam conducted with a chaperone present.  Constitutional:      Appearance: Normal appearance.  Cardiovascular:     Rate and Rhythm: Normal rate and regular rhythm.     Pulses: Normal pulses.     Heart sounds:  Normal heart sounds.  Pulmonary:     Effort: Pulmonary effort is normal.     Breath sounds: Normal breath sounds.  Chest:  Breasts:     Right: Mass present. No axillary adenopathy or supraclavicular adenopathy.     Left: Normal. No axillary adenopathy or supraclavicular adenopathy.     Musculoskeletal:     Cervical back: Neck supple.  Lymphadenopathy:     Upper Body:     Right upper body: No supraclavicular or axillary adenopathy.     Left upper body: No supraclavicular or axillary adenopathy.  Skin:    General: Skin is warm and dry.  Neurological:     Mental Status: She is alert and oriented to person, place, and time.  Psychiatric:        Mood  and Affect: Mood normal.        Behavior: Behavior normal.    Labs and Radiology:   October 06, 2019 screening mammograms completed at [facility] were independently reviewed.  FINDINGS: The breasts have scattered areas of fibroglandular density.  At site of the previously seen X shaped biopsy marker in the right central slightly outer breast is a small amount of fat necrosis. Prior outside biopsy yielded complex sclerosing lesion. Biopsy marker is no longer visualized.  Stable biopsy marker and associated focal asymmetry in the left breast.  There are no suspicious masses, calcifications, or other findings in either breast.  IMPRESSION: No mammographic evidence of malignancy.  BIRADS ASSESSMENT: 2 - Benign  Ultrasound:  Ultrasound examination of the area of focal nodularity was completed.  This showed a 5 mm area of distortion with dense acoustic shadowing consistent with fat necrosis.  In review of the above-noted mammograms, no correlating defect.  BI-RADS-2.  Colonoscopy  August 22, 2014 exam was normal.  5-year follow-up recommended based on family history of a first-degree relative with colon cancer at a young age.     Assessment:     Focal scarring along the incision from her prior radial scar incision.  No suspicion for malignancy.    Plan:     With the clinical exam and ultrasound findings, as well as normal mammograms under 4 months ago, no additional imaging or treatment is required.  The area is asymptomatic.  Were to enlarge and become tender formal excision would be appropriate.    Patient to follow up as scheduled and is aware to call for any new issues or concerns.  The patient was not sure where her mammograms have been completed at the time of her visit, they were not at [facility] or at [place], but were eventually identified having been completed through [facility] facility.  I contacted the patient with the mammogram findings and confirmed  that she has not had a colonoscopy since 2016.  With her brother being diagnosed with colon cancer at age 21 she is certainly due.  She reports that she will likely contact the office on Monday, January 10 to arrange to have this scheduled.  She is welcome first with the prep and the reason that this is an important exam.  The patient will call if the breast lesion enlarges, becomes tender or irritating.  Otherwise she will continue annual screening mammograms with her PCP.  Entered by Ledell Noss, CMA, acting as a scribe for Dr. Hervey Ard, MD.   The documentation recorded by the scribe accurately reflects the service I personally performed and the decisions made by me.   Robert Bellow, MD FACS

## 2020-02-05 ENCOUNTER — Other Ambulatory Visit: Payer: BC Managed Care – PPO

## 2020-03-11 ENCOUNTER — Other Ambulatory Visit: Payer: Self-pay

## 2020-03-11 ENCOUNTER — Other Ambulatory Visit
Admission: RE | Admit: 2020-03-11 | Discharge: 2020-03-11 | Disposition: A | Discharge status: Home / Self Care | Payer: BC Managed Care – PPO | Source: Ambulatory Visit | Attending: General Surgery | Admitting: General Surgery

## 2020-03-11 DIAGNOSIS — Z85828 Personal history of other malignant neoplasm of skin: Secondary | ICD-10-CM | POA: Diagnosis not present

## 2020-03-11 DIAGNOSIS — Z20822 Contact with and (suspected) exposure to covid-19: Secondary | ICD-10-CM | POA: Insufficient documentation

## 2020-03-11 DIAGNOSIS — Z79899 Other long term (current) drug therapy: Secondary | ICD-10-CM | POA: Diagnosis not present

## 2020-03-11 DIAGNOSIS — Z882 Allergy status to sulfonamides status: Secondary | ICD-10-CM | POA: Diagnosis not present

## 2020-03-11 DIAGNOSIS — Z800 Family history of malignant neoplasm of digestive organs: Secondary | ICD-10-CM | POA: Diagnosis not present

## 2020-03-11 DIAGNOSIS — Z8541 Personal history of malignant neoplasm of cervix uteri: Secondary | ICD-10-CM | POA: Diagnosis not present

## 2020-03-11 DIAGNOSIS — Z01812 Encounter for preprocedural laboratory examination: Secondary | ICD-10-CM | POA: Insufficient documentation

## 2020-03-11 DIAGNOSIS — Z1211 Encounter for screening for malignant neoplasm of colon: Secondary | ICD-10-CM | POA: Diagnosis not present

## 2020-03-11 DIAGNOSIS — Z79890 Hormone replacement therapy (postmenopausal): Secondary | ICD-10-CM | POA: Diagnosis not present

## 2020-03-11 DIAGNOSIS — Z791 Long term (current) use of non-steroidal anti-inflammatories (NSAID): Secondary | ICD-10-CM | POA: Diagnosis not present

## 2020-03-11 DIAGNOSIS — Z87891 Personal history of nicotine dependence: Secondary | ICD-10-CM | POA: Diagnosis not present

## 2020-03-11 LAB — SARS CORONAVIRUS 2 (TAT 6-24 HRS): SARS Coronavirus 2: NEGATIVE

## 2020-03-12 ENCOUNTER — Encounter: Payer: Self-pay | Admitting: General Surgery

## 2020-03-13 ENCOUNTER — Encounter: Payer: Self-pay | Admitting: General Surgery

## 2020-03-13 ENCOUNTER — Encounter
Admission: RE | Disposition: A | Discharge status: Home / Self Care | Payer: Self-pay | Source: Home / Self Care | Attending: General Surgery

## 2020-03-13 ENCOUNTER — Ambulatory Visit
Admission: RE | Admit: 2020-03-13 | Discharge: 2020-03-13 | Disposition: A | Discharge status: Home / Self Care | Payer: BC Managed Care – PPO | Attending: General Surgery | Admitting: General Surgery

## 2020-03-13 ENCOUNTER — Ambulatory Visit: Payer: BC Managed Care – PPO | Admitting: Anesthesiology

## 2020-03-13 ENCOUNTER — Other Ambulatory Visit: Payer: Self-pay

## 2020-03-13 DIAGNOSIS — Z1211 Encounter for screening for malignant neoplasm of colon: Secondary | ICD-10-CM | POA: Insufficient documentation

## 2020-03-13 DIAGNOSIS — Z882 Allergy status to sulfonamides status: Secondary | ICD-10-CM | POA: Insufficient documentation

## 2020-03-13 DIAGNOSIS — Z20822 Contact with and (suspected) exposure to covid-19: Secondary | ICD-10-CM | POA: Insufficient documentation

## 2020-03-13 DIAGNOSIS — Z87891 Personal history of nicotine dependence: Secondary | ICD-10-CM | POA: Insufficient documentation

## 2020-03-13 DIAGNOSIS — Z79890 Hormone replacement therapy (postmenopausal): Secondary | ICD-10-CM | POA: Insufficient documentation

## 2020-03-13 DIAGNOSIS — Z79899 Other long term (current) drug therapy: Secondary | ICD-10-CM | POA: Insufficient documentation

## 2020-03-13 DIAGNOSIS — Z85828 Personal history of other malignant neoplasm of skin: Secondary | ICD-10-CM | POA: Insufficient documentation

## 2020-03-13 DIAGNOSIS — Z8541 Personal history of malignant neoplasm of cervix uteri: Secondary | ICD-10-CM | POA: Insufficient documentation

## 2020-03-13 DIAGNOSIS — Z791 Long term (current) use of non-steroidal anti-inflammatories (NSAID): Secondary | ICD-10-CM | POA: Insufficient documentation

## 2020-03-13 DIAGNOSIS — Z800 Family history of malignant neoplasm of digestive organs: Secondary | ICD-10-CM | POA: Insufficient documentation

## 2020-03-13 HISTORY — PX: COLONOSCOPY WITH PROPOFOL: SHX5780

## 2020-03-13 SURGERY — COLONOSCOPY WITH PROPOFOL
Anesthesia: General

## 2020-03-13 MED ORDER — PROPOFOL 500 MG/50ML IV EMUL
INTRAVENOUS | Status: DC | PRN
  Administered 2020-03-13: 150 ug/kg/min via INTRAVENOUS

## 2020-03-13 MED ORDER — LIDOCAINE HCL (CARDIAC) PF 100 MG/5ML IV SOSY
PREFILLED_SYRINGE | INTRAVENOUS | Status: DC | PRN
  Administered 2020-03-13: 40 mg via INTRAVENOUS

## 2020-03-13 MED ORDER — SODIUM CHLORIDE 0.9 % IV SOLN: INTRAVENOUS | Status: DC

## 2020-03-13 MED ORDER — PROPOFOL 500 MG/50ML IV EMUL
INTRAVENOUS | Status: AC
  Filled 2020-03-13: qty 50

## 2020-03-13 MED ORDER — PROPOFOL 10 MG/ML IV BOLUS
INTRAVENOUS | Status: DC | PRN
  Administered 2020-03-13: 10 mg via INTRAVENOUS
  Administered 2020-03-13: 80 mg via INTRAVENOUS

## 2020-03-13 NOTE — Op Note (Signed)
[facility] Gastroenterology Patient Name: Tammy Schroeder Procedure Date: 03/13/2020 8:07 AM MRN: [id number] Account #: [id number] Date of Birth: 1965-08-19 Admit Type: Outpatient Age: 54 Room: [facility] ENDO ROOM 1 Gender: Female Note Status: Finalized Procedure:             Colonoscopy Indications:           Family history of colon cancer in a first-degree                         relative before age 58 years Providers:             Robert Bellow, MD Referring MD:          Rubbie Battiest. Iona Beard MD, MD (Referring MD) Medicines:             Monitored Anesthesia Care Complications:         No immediate complications. Procedure:             Pre-Anesthesia Assessment:                        - Prior to the procedure, a History and Physical was                         performed, and patient medications, allergies and                         sensitivities were reviewed. The patient's tolerance                         of previous anesthesia was reviewed.                        - The risks and benefits of the procedure and the                         sedation options and risks were discussed with the                         patient. All questions were answered and informed                         consent was obtained.                        After obtaining informed consent, the colonoscope was                         passed under direct vision. Throughout the procedure,                         the patient's blood pressure, pulse, and oxygen                         saturations were monitored continuously. The was                         introduced through the anus and advanced to the the  terminal ileum. The colonoscopy was performed without                         difficulty. The patient tolerated the procedure well.                         The quality of the bowel preparation was excellent. Findings:      The entire examined colon appeared normal on direct  and retroflexion       views. Impression:            - The entire examined colon is normal on direct and                         retroflexion views.                        - No specimens collected. Recommendation:        - Repeat colonoscopy in 5 years for surveillance. Procedure Code(s):     --- Professional ---                        [phone], Colonoscopy, flexible; diagnostic, including                         collection of specimen(s) by brushing or washing, when                         performed (separate procedure) Diagnosis Code(s):     --- Professional ---                        Z80.0, Family history of malignant neoplasm of                         digestive organs CPT copyright 2019 American Medical Association. All rights reserved. The codes documented in this report are preliminary and upon coder review may  be revised to meet current compliance requirements. Robert Bellow, MD 03/13/2020 8:58:24 AM This report has been signed electronically. Number of Addenda: 0 Note Initiated On: 03/13/2020 8:07 AM Scope Withdrawal Time: 0 hours 13 minutes 5 seconds  Total Procedure Duration: 0 hours 19 minutes 12 seconds  Estimated Blood Loss:  Estimated blood loss: none.      [facility]

## 2020-03-13 NOTE — Anesthesia Preprocedure Evaluation (Signed)
Anesthesia Evaluation  Patient identified by MRN, date of birth, ID band Patient awake    Reviewed: Allergy & Precautions, H&P , NPO status , reviewed documented beta blocker date and time   History of Anesthesia Complications Negative for: history of anesthetic complications  Airway Mallampati: II  TM Distance: >3 FB Neck ROM: full    Dental  (+) Teeth Intact, Dental Advidsory Given Permanent retainer:   Pulmonary neg pulmonary ROS, former smoker,    Pulmonary exam normal        Cardiovascular Exercise Tolerance: Good negative cardio ROS Normal cardiovascular exam     Neuro/Psych Seizures -, Well Controlled,  PSYCHIATRIC DISORDERS Depression    GI/Hepatic Neg liver ROS, hiatal hernia, GERD  Medicated and Controlled,  Endo/Other  negative endocrine ROS  Renal/GU negative Renal ROS     Musculoskeletal   Abdominal   Peds  Hematology   Anesthesia Other Findings Past Medical History: No date: Cervical cancer [facility])     Comment:  age 55 No date: Depression 05-04-13: Genetic testing     Comment:  negative colaris testing No date: GERD (gastroesophageal reflux disease) No date: H. pylori infection No date: History of hiatal hernia No date: Neuropathy 2007: Seizures ([place])     Comment:  1 seizure from being taken off of a migraine medication               abruptly 2013, 2014: Skin cancer Past Surgical History: age 4: ABDOMINAL HYSTERECTOMY No date: APPENDECTOMY 2011: BREAST BIOPSY; Left     Comment:  bx/clip-Vacuum biopsy 2:00 position, benign breast               tissue with fibrocystic and fibroadenoma-like changes. 06/08/2017: BREAST BIOPSY; Right     Comment:  affirm bx x clip    path pending No date: BREAST CYST ASPIRATION; Right     Comment:  neg 10/07/2009: BREAST SURGERY; Left     Comment:  Vacuum biopsy 2:00 position, benign breast tissue with               fibrocystic and fibroadenoma-like  changes. May 20, 2004: CHOLECYSTECTOMY     Comment:  Chronic cholecystitis and cholelithiasis 2006: COLONOSCOPY 08/22/2014: COLONOSCOPY; N/A     Comment:  Procedure: COLONOSCOPY;  Surgeon: Robert Bellow, MD;              Location: [facility] ENDOSCOPY;  Service: Endoscopy;                Laterality: N/A; 08/29/2009: COLONOSCOPY W/ POLYPECTOMY; N/A     Comment:  Tubular adenoma from the ascending colon. Gaylyn Cheers, M.D. 2007: LASIK 2005: OOPHORECTOMY BMI    Body Mass Index:  31.05 kg/m     Reproductive/Obstetrics                             Anesthesia Physical  Anesthesia Plan  ASA: II  Anesthesia Plan: General LMA   Post-op Pain Management:    Induction: Intravenous  PONV Risk Score and Plan: 3 and Treatment may vary due to age or medical condition, TIVA and Propofol infusion  Airway Management Planned: Natural Airway and Nasal Cannula  Additional Equipment:   Intra-op Plan:   Post-operative Plan:   Informed Consent: I have reviewed the patients History and Physical, chart, labs and discussed the procedure including the risks, benefits and alternatives for  the proposed anesthesia with the patient or authorized representative who has indicated his/her understanding and acceptance.     Dental Advisory Given  Plan Discussed with: CRNA  Anesthesia Plan Comments:         Anesthesia Quick Evaluation

## 2020-03-13 NOTE — Anesthesia Procedure Notes (Signed)
Date/Time: 03/13/2020 8:33 AM Performed by: Doreen Salvage, CRNA Pre-anesthesia Checklist: Patient identified, Emergency Drugs available, Suction available and Patient being monitored Patient Re-evaluated:Patient Re-evaluated prior to induction Oxygen Delivery Method: Nasal cannula Induction Type: IV induction Dental Injury: Teeth and Oropharynx as per pre-operative assessment  Comments: Nasal cannula with etCO2 monitoring

## 2020-03-13 NOTE — Anesthesia Postprocedure Evaluation (Signed)
Anesthesia Post Note  Patient: Tammy Schroeder  Procedure(s) Performed: COLONOSCOPY WITH PROPOFOL (N/A )  Patient location during evaluation: PACU Anesthesia Type: General Level of consciousness: awake and alert Pain management: pain level controlled Vital Signs Assessment: post-procedure vital signs reviewed and stable Respiratory status: spontaneous breathing, nonlabored ventilation, respiratory function stable and patient connected to nasal cannula oxygen Cardiovascular status: blood pressure returned to baseline and stable Postop Assessment: no apparent nausea or vomiting Anesthetic complications: no   No complications documented.   Last Vitals:  Vitals:   03/13/20 0900 03/13/20 0910  BP: 117/66 122/71  Pulse: 81 78  Resp: 15 (!) 25  Temp: 36.6 C   SpO2: 100% 100%    Last Pain:  Vitals:   03/13/20 0910  TempSrc:   PainSc: 0-No pain                 Martha Clan

## 2020-03-13 NOTE — Transfer of Care (Signed)
Immediate Anesthesia Transfer of Care Note  Patient: Tammy Schroeder  Procedure(s) Performed: Procedure(s): COLONOSCOPY WITH PROPOFOL (N/A)  Patient Location: PACU and Endoscopy Unit  Anesthesia Type:General  Level of Consciousness: sedated  Airway & Oxygen Therapy: Patient Spontanous Breathing and Patient connected to nasal cannula oxygen  Post-op Assessment: Report given to RN and Post -op Vital signs reviewed and stable  Post vital signs: Reviewed and stable  Last Vitals:  Vitals:   03/13/20 0804 03/13/20 0900  BP: 126/78 117/66  Pulse: 70 81  Resp: 18 15  Temp: (!) 35.6 C   SpO2: 269% 485%    Complications: No apparent anesthesia complications

## 2020-03-13 NOTE — H&P (Signed)
Tammy Schroeder [id number] 06/26/1965     HPI:  54 y/o woman with a family history of colon cancer in a first degree relative.  For five year follow up colonoscopy.   Medications Prior to Admission  Medication Sig Dispense Refill Last Dose  . amitriptyline (ELAVIL) 25 MG tablet Take 25 mg by mouth at bedtime.   11 03/12/2020 at 2200  . atorvastatin (LIPITOR) 10 MG tablet Take 10 mg by mouth daily.   03/12/2020 at 1800  . BIOTIN PO Take 1 tablet by mouth daily.   Past Week at Unknown time  . chlorpheniramine-HYDROcodone (TUSSIONEX PENNKINETIC ER) 10-8 MG/5ML SUER Take 5 mLs by mouth 2 (two) times daily. 115 mL 0 Past Month at Unknown time  . CRANBERRY PO Take 2 tablets by mouth daily.   Past Week at Unknown time  . docusate sodium (COLACE) 100 MG capsule Take 2 capsules by mouth daily as needed (constipation).    Past Week at Unknown time  . estradiol (ESTRACE) 2 MG tablet Take 2 mg by mouth daily.    Past Week at Unknown time  . gabapentin (NEURONTIN) 100 MG capsule Take 300 mg by mouth 3 (three) times daily.    03/12/2020 at 2200  . meloxicam (MOBIC) 15 MG tablet Take 15 mg by mouth daily.   03/12/2020 at 1200  . pantoprazole (PROTONIX) 40 MG tablet Take 40 mg by mouth daily.   03/13/2020 at 0500  . SUMAtriptan (IMITREX) 100 MG tablet Take 1 tablet by mouth every 2 (two) hours as needed for migraine.   5 Past Week at Unknown time  . traZODone (DESYREL) 150 MG tablet Take 150 mg by mouth at bedtime.    Past Month at Unknown time  . zolpidem (AMBIEN CR) 12.5 MG CR tablet Take 12.5 mg by mouth at bedtime as needed for sleep.   03/12/2020 at 2200.  . benzonatate (TESSALON) 200 MG capsule Take one cap TID PRN cough (Patient not taking: Reported on 03/13/2020) 30 capsule 0 Completed Course at Unknown time  . citalopram (CELEXA) 20 MG tablet Take 10 mg by mouth daily.  (Patient not taking: Reported on 03/13/2020)   Not Taking at Unknown time   Allergies  Allergen Reactions  . Sulfa Antibiotics Itching   Past  Medical History:  Diagnosis Date  . Cervical cancer (HCC)    age 19  . Depression   . 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 GERD (gastroesophageal reflux disease)   . H. pylori infection   . History of hiatal hernia   . Neuropathy   . Seizures (HCC) 2007   1 seizure from being taken off of a migraine medication abruptly  . Skin cancer 2013, 2014   Past Surgical History:  Procedure Laterality Date  . ABDOMINAL HYSTERECTOMY  age 29  . APPENDECTOMY    . BREAST BIOPSY Left 2011   bx/clip-Vacuum biopsy 2:00 position, benign breast tissue with fibrocystic and fibroadenoma-like changes.  . BREAST BIOPSY Right 06/08/2017   affirm bx x clip     neg  . BREAST BIOPSY Right 06/18/2017   Procedure: BREAST BIOPSY;  Surgeon: Byrnett, Jeffrey W, MD;  Location: [facility] ORS;  Service: General;  Laterality: Right;  . BREAST CYST ASPIRATION Right    neg  . BREAST SURGERY Left 10/07/2009   Vacuum biopsy 2:00 position, benign breast tissue with fibrocystic and fibroadenoma-like changes.  . CHOLECYSTECTOMY  May 20, 2004   Chronic cholecystitis and cholelithiasis  .   COLONOSCOPY  2006  . COLONOSCOPY N/A 08/22/2014   Procedure: COLONOSCOPY;  Surgeon: Robert Bellow, MD;  Location: [facility] ENDOSCOPY;  Service: Endoscopy;  Laterality: N/A;  . COLONOSCOPY W/ POLYPECTOMY N/A 08/29/2009   Tubular adenoma from the ascending colon. Gaylyn Cheers, M.D.  . LASIK  2007  . OOPHORECTOMY  2005   Social History   Socioeconomic History  . Marital status: Married    Spouse name: Not on file  . Number of children: Not on file  . Years of education: Not on file  . Highest education level: Not on file  Occupational History  . Not on file  Tobacco Use  . Smoking status: Former Smoker    Packs/day: 1.00    Years: 29.00    Pack years: 29.00    Types: Cigarettes    Quit date: 04/02/2007    Years since quitting: 12.9  . Smokeless tobacco: Never Used  Vaping Use  . Vaping Use: Never  used  Substance and Sexual Activity  . Alcohol use: Yes    Comment: rarely  . Drug use: No  . Sexual activity: Yes  Other Topics Concern  . Not on file  Social History Narrative  . Not on file   Social Determinants of Health   Financial Resource Strain: Not on file  Food Insecurity: Not on file  Transportation Needs: Not on file  Physical Activity: Not on file  Stress: Not on file  Social Connections: Not on file  Intimate Partner Violence: Not on file   Social History   Social History Narrative  . Not on file     ROS: Negative.     PE: HEENT: Negative. Lungs: Clear. Cardio: RR.   Assessment/Plan:  Proceed with planned endoscopy.  Forest Gleason [facility] 03/13/2020

## 2020-03-14 ENCOUNTER — Encounter: Payer: Self-pay | Admitting: General Surgery

## 2021-04-04 ENCOUNTER — Other Ambulatory Visit: Payer: Self-pay | Admitting: General Surgery

## 2021-04-04 NOTE — Progress Notes (Signed)
Subjective:  ?  ? Patient ID: Tammy Schroeder is a 56 y.o. female. ?  ?HPI ?  ?The following portions of the patient's history were reviewed and updated as appropriate. ?  ?This an established patient is here today for: office visit. Here for evaluation of uncontrollable GERD and possible hernia. ?She has severe belching, bad taste in mouth, worse over the past 6 months. She takes Pepcid and Protonix twice a day. She will use Tums and saltines as well to try and help. ?  ?The pain she reports she experiences dysphagia with oatmeal.  Needs to sleep nearly erect to minimize nocturnal reflux. ?  ?She reports if she bends over within an hour or 2 of a meal fluid will come up into her mouth. ?  ?Bowels move every 3 days with the use of MiraLAX and Colace as needed.  She will on occasion make use of Dulcolax  tablets as well.  She remembers as a child being constipated and receiving enemas from her grandmother.  She denies any bleeding unless hemorrhoids flare, maybe every month. ?  ?.  ?  ?Review of Systems  ?Constitutional: Negative for chills and fever.  ?Respiratory: Negative for cough.   ?  ?  ?   ?Chief Complaint  ?Patient presents with  ? Pre-op Exam  ?  ?  ?BP 124/82   Pulse 99   Temp 36.7 ?C (98 ?F)   Ht 165.1 cm ('5\' 5"'$ )   Wt 69.4 kg (153 lb)   SpO2 96%   BMI 25.46 kg/m?   ?  ?Weight at the time of her May 31, 2017 exam: 189 pounds. ?  ?    ?Past Medical History:  ?Diagnosis Date  ? Adenomatous colon polyp    ?  Personal history of adenomatous colon polyps.  ? Anxiety    ? Arthritis    ?  hands  ? Basal cell carcinoma 2015  ?  left shoulder  ? Depression    ? Dysplasia of cervix    ?  History of dysplasia cells on cervix.  ? Endometrial cancer (CMS-HCC)    ? Esophageal ulcer    ?  reported by patient upon ENT evaluation.  ? Gastritis    ? Genetic testing 05/04/2013  ? GERD (gastroesophageal reflux disease) 2017  ? H. pylori infection 2006  ?  H.pylori treatment with Prevpac  ? Helicobacter pylori  gastritis 06/10/2017  ?  Reported triple therapy in 2013 EGD with active infection 05/2017 - treated with bismuth-based quad therapy.  ? Hiatal hernia    ? Hormone deficiency 2010  ?  estridol  ? Hyperlipidemia    ? Insomnia    ? Lung nodule 06/11/2015  ?  31m left upper lobe nodule [facility]. 06/03/2015- needs repeat 05/2016  ? Migraine headache    ? Neuropathy    ? PONV (postoperative nausea and vomiting)    ? Poor intravenous access    ? Sciatica    ? Seizures (CMS-HCC) 2007  ? Skin cancer    ?  ?  ?     ?Past Surgical History:  ?Procedure Laterality Date  ? TUBAL LIGATION   1988  ? HYSTERECTOMY   1997  ?  w/ BSO for precancerous changes, benign reasons  ? CHOLECYSTECTOMY   2005  ? OOPHORECTOMY   2005  ? COLONOSCOPY   2006  ? BREAST BIOPSY Left 2011  ?  benign  ? COLONOSCOPY   08/22/2014  ?   (Dr.  Braxton Weisbecker) - normal colon; repeat in 08/2019 due to history of adenomatous colon polyps.  ? BREAST EXCISIONAL BIOPSY Right 06/08/2017  ?  15 mm radial scar without atypia, right lower outer quadrant.  ? ESOPHAGOGASTRODOUDENOSCOPY   06/09/2017  ?  (Dr. Clydene Laming @ [facility]) - normal esophagus and Z-line, GE flap classified as Hill Grade II, gastritis in fundus and body, normal duodenum. (path: chronic, focally active gastritis, + H pylori).  ? BREAST EXCISIONAL BIOPSY Right 06/18/2017  ? REVISION WRIST ARTHROPLASTY Right 10/17/2018  ?  Procedure: R- ARTHROPLASTY, INTERPOSITION, INTERCARPAL OR CARPOMETACARPAL JOINTS;  Surgeon: Darnelle Bos, MD;  Location: [facility] OR;  Service: Orthopedics;  Laterality: Right;  ? INSERTION WIRE/PIN W/SKELETAL TRACTION Right 10/17/2018  ?  Procedure: R- INSERTION OF WIRE OR PIN WITH APPLICATION OF SKELETAL TRACTION, INCLUDING REMOVAL (SEPARATE PROCEDURE);  Surgeon: Darnelle Bos, MD;  Location: [facility] OR;  Service: Orthopedics;  Laterality: Right;  ? INTRAOPERATIVE FLUOROSCOPY Right 10/17/2018  ?  Procedure: R- FLUOROSCOPY (SEPARATE PROCEDURE), UP TO 1 HOUR PHYSICIAN OR OTHER QUALIFIED  HEALTH CARE PROFESSIONAL TIME;  Surgeon: Klifto, Merwyn Katos, MD;  Location: [facility] OR;  Service: Orthopedics;  Laterality: Right;  ? ENDOSCOPIC REPAIR GLUTEUS MEDIUS Left 08/24/2019  ?  Procedure: (RCC) Left ENDOSCOPIC REPAIR GLUTEUS MEDIUS 2;  Surgeon: Haskel Schroeder, MD;  Location: [facility] OR;  Service: Orthopedics;  Laterality: Left;  ? ARTHROSCOPY HIP Left 08/24/2019  ?  Procedure: ARTHROSCOPY, HIP, DIAGNOSTIC WITH OR WITHOUT SYNOVIAL BIOPSY;  Surgeon: Haskel Schroeder, MD;  Location: [facility] OR;  Service: Orthopedics;  Laterality: Left;  ? ENDOSCOPIC REPAIR GLUTEUS MEDIUS Right 02/05/2020  ?  Procedure: RIGHT ENDOSCOPIC REPAIR GLUTEUS MEDIUS 2;  Surgeon: Haskel Schroeder, MD;  Location: [facility] OR;  Service: Orthopedics;  Laterality: Right;  ? COLONOSCOPY   03/13/2020  ?  Normal colon/FHx CC-Brother/Repeat 33yr/JWB  ? APPENDECTOMY      ?  at 56years old.  ? ASPIRATION CYST BREAST Right    ? hand surgery      ?  ?  ?  ?        ?OB History   ?  Gravida  ?3  ? Para  ?2  ? Term  ?2  ? Preterm  ?   ? AB  ?1  ? Living  ?2  ?  ?  SAB  ?1  ? IAB  ?   ? Ectopic  ?   ? Molar  ?   ? Multiple  ?   ? Live Births  ?   ?  ?  ?  Obstetric Comments  ?Age at first period 89?Age of first pregnancy 89?   ?  ?   ?  ?  ?Social History  ?  ?  ?     ?Socioeconomic History  ? Marital status: Married  ? Number of children: 2  ?Occupational History  ? Occupation: SEducation officer, [employer]  ?Tobacco Use  ? Smoking status: Former  ?    Packs/day: 1.00  ?    Years: 29.00  ?    Pack years: 29.00  ?    Types: Cigarettes  ?    Start date: 09/13/1978  ?    Quit date: 04/08/2007  ?    Years since quitting: 13.9  ? Smokeless tobacco: Never  ?Vaping Use  ? Vaping Use: Never used  ?Substance and Sexual Activity  ? Alcohol use: Not Currently  ?    Comment: socially  ?  Drug use: Never  ? Sexual activity: Yes  ?    Partners: Male  ?    Birth control/protection: Post-menopausal  ?    Comment: husband  ?Social History Narrative  ?   Married.  Husband is a former TEFL teacher, now retired.  ?  ?  ?  ?     ?Allergies  ?Allergen Reactions  ? Trazodone Other (See Comments)  ?    GI upset  ? Sulfa (Sulfonamide Antibiotics) Itching  ?  ?  ?Current Medications  ?      ?Current Outpatient Medications  ?Medication Sig Dispense Refill  ? albuterol 90 mcg/actuation inhaler INHALE 2 INHALATIONS INTO THE LUNGS EVERY 4 (FOUR) HOURS AS NEEDED FOR WHEEZING 8.5 each 11  ? amitriptyline (ELAVIL) 75 MG tablet TAKE 1 TABLET BY MOUTH NIGHTLY 90 tablet 3  ? atorvastatin (LIPITOR) 20 MG tablet Take 1 tablet (20 mg total) by mouth once daily 30 tablet 11  ? cranberry extract 200 mg capsule Take 200 mg by mouth every morning         ? docusate (COLACE) 100 MG capsule Take 100 mg by mouth once daily as needed.  ?       ? estradioL (ESTRACE) 2 MG tablet Take 1 tablet (2 mg total) by mouth once daily 90 tablet 3  ? famotidine (PEPCID) 20 MG tablet Take 1 tablet (20 mg total) by mouth 2 (two) times daily 60 tablet 11  ? gabapentin (NEURONTIN) 100 MG capsule Take 2 capsules (200 mg total) by mouth at bedtime 60 capsule 11  ? lidocaine-diphenhydramine-aluminum-magnesium-simethicone (FIRST MOUTHWASH BLM) oral suspension Swish and spit 5 mLs every 6 (six) hours as needed 1 Bottle 0  ? magnesium oxide (MAG-OX) 400 mg tablet Take 400 mg by mouth every morning         ? meloxicam (MOBIC) 15 MG tablet Take 1 tablet (15 mg total) by mouth once daily 90 tablet 3  ? pantoprazole (PROTONIX) 40 MG DR tablet Take 1 tablet (40 mg total) by mouth 2 (two) times daily before meals 90 tablet 0  ? phentermine (ADIPEX-P) 37.5 mg tablet TAKE 1 TABLET IN THE AM      ? polyethylene glycol (MIRALAX) powder One bottle for colonoscopy prep. Use as directed. 255 g 0  ? potassium chloride (K-DUR,KLOR-CON) 20 MEQ ER tablet Take 20 mEq by mouth every morning         ? predniSONE (DELTASONE) 10 MG tablet Take 4 tabs on days 1-3, then 3 tabs on days 4-6, then 2 tabs on days 7-9 then 1 tab on day 10 then off  28 tablet 0  ? SUMAtriptan (IMITREX) 100 MG tablet Take 1 tablet (100 mg total) by mouth as needed TAKE ONE TABLET BY MOUTH AS NEEDED. MAY TAKE A SECOND DOSE AFTER 2 HOURS IF NEEDED. 9 tablet 9  ? topiramate (TOPAMAX) 2

## 2021-04-08 ENCOUNTER — Encounter: Payer: Self-pay | Admitting: General Surgery

## 2021-04-09 ENCOUNTER — Encounter
Admission: RE | Disposition: A | Discharge status: Home / Self Care | Payer: Self-pay | Source: Ambulatory Visit | Attending: General Surgery

## 2021-04-09 ENCOUNTER — Ambulatory Visit: Payer: BC Managed Care – PPO | Admitting: Registered Nurse

## 2021-04-09 ENCOUNTER — Encounter: Payer: Self-pay | Admitting: General Surgery

## 2021-04-09 ENCOUNTER — Ambulatory Visit
Admission: RE | Admit: 2021-04-09 | Discharge: 2021-04-09 | Disposition: A | Discharge status: Home / Self Care | Payer: BC Managed Care – PPO | Source: Ambulatory Visit | Attending: General Surgery | Admitting: General Surgery

## 2021-04-09 DIAGNOSIS — K219 Gastro-esophageal reflux disease without esophagitis: Secondary | ICD-10-CM | POA: Insufficient documentation

## 2021-04-09 DIAGNOSIS — Z8669 Personal history of other diseases of the nervous system and sense organs: Secondary | ICD-10-CM | POA: Insufficient documentation

## 2021-04-09 DIAGNOSIS — Z8541 Personal history of malignant neoplasm of cervix uteri: Secondary | ICD-10-CM | POA: Insufficient documentation

## 2021-04-09 DIAGNOSIS — Z87891 Personal history of nicotine dependence: Secondary | ICD-10-CM | POA: Diagnosis not present

## 2021-04-09 DIAGNOSIS — Z853 Personal history of malignant neoplasm of breast: Secondary | ICD-10-CM | POA: Insufficient documentation

## 2021-04-09 DIAGNOSIS — Z8619 Personal history of other infectious and parasitic diseases: Secondary | ICD-10-CM | POA: Diagnosis not present

## 2021-04-09 DIAGNOSIS — Z8759 Personal history of other complications of pregnancy, childbirth and the puerperium: Secondary | ICD-10-CM | POA: Diagnosis not present

## 2021-04-09 DIAGNOSIS — Z85828 Personal history of other malignant neoplasm of skin: Secondary | ICD-10-CM | POA: Diagnosis not present

## 2021-04-09 DIAGNOSIS — F32A Depression, unspecified: Secondary | ICD-10-CM | POA: Diagnosis not present

## 2021-04-09 HISTORY — DX: Other specified postprocedural states: Z98.890

## 2021-04-09 HISTORY — PX: ESOPHAGOGASTRODUODENOSCOPY (EGD) WITH PROPOFOL: SHX5813

## 2021-04-09 HISTORY — DX: Malignant neoplasm of unspecified site of unspecified female breast: C50.919

## 2021-04-09 SURGERY — ESOPHAGOGASTRODUODENOSCOPY (EGD) WITH PROPOFOL
Anesthesia: General

## 2021-04-09 MED ORDER — PROPOFOL 500 MG/50ML IV EMUL
INTRAVENOUS | Status: AC
  Filled 2021-04-09: qty 150

## 2021-04-09 MED ORDER — PROPOFOL 10 MG/ML IV BOLUS
INTRAVENOUS | Status: DC | PRN
  Administered 2021-04-09 (×2): 10 mg via INTRAVENOUS
  Administered 2021-04-09: 80 mg via INTRAVENOUS

## 2021-04-09 MED ORDER — LIDOCAINE HCL (PF) 2 % IJ SOLN
INTRAMUSCULAR | Status: AC
  Filled 2021-04-09: qty 20

## 2021-04-09 MED ORDER — DEXMEDETOMIDINE HCL 200 MCG/2ML IV SOLN
INTRAVENOUS | Status: DC | PRN
  Administered 2021-04-09: 20 ug via INTRAVENOUS

## 2021-04-09 MED ORDER — SODIUM CHLORIDE 0.9 % IV SOLN
INTRAVENOUS | Status: DC
Start: 2021-04-09 — End: 2021-04-09

## 2021-04-09 MED ORDER — EPHEDRINE 5 MG/ML INJ
INTRAVENOUS | Status: AC
  Filled 2021-04-09: qty 5

## 2021-04-09 MED ORDER — LIDOCAINE HCL (CARDIAC) PF 100 MG/5ML IV SOSY
PREFILLED_SYRINGE | INTRAVENOUS | Status: DC | PRN
  Administered 2021-04-09: 100 mg via INTRAVENOUS

## 2021-04-09 MED ORDER — PHENYLEPHRINE 40 MCG/ML (10ML) SYRINGE FOR IV PUSH (FOR BLOOD PRESSURE SUPPORT)
PREFILLED_SYRINGE | INTRAVENOUS | Status: AC
  Filled 2021-04-09: qty 10

## 2021-04-09 MED ORDER — PROPOFOL 500 MG/50ML IV EMUL
INTRAVENOUS | Status: DC | PRN
  Administered 2021-04-09: 150 ug/kg/min via INTRAVENOUS

## 2021-04-09 MED ORDER — PROPOFOL 10 MG/ML IV BOLUS
INTRAVENOUS | Status: AC
  Filled 2021-04-09: qty 20

## 2021-04-09 NOTE — Anesthesia Postprocedure Evaluation (Signed)
Anesthesia Post Note ? ?Patient: Tammy Schroeder ? ?Procedure(s) Performed: ESOPHAGOGASTRODUODENOSCOPY (EGD) WITH PROPOFOL ? ?Patient location during evaluation: Endoscopy ?Anesthesia Type: General ?Level of consciousness: awake and alert ?Pain management: pain level controlled ?Vital Signs Assessment: post-procedure vital signs reviewed and stable ?Respiratory status: spontaneous breathing, nonlabored ventilation, respiratory function stable and patient connected to nasal cannula oxygen ?Cardiovascular status: blood pressure returned to baseline and stable ?Postop Assessment: no apparent nausea or vomiting ?Anesthetic complications: no ? ? ?No notable events documented. ? ? ?Last Vitals:  ?Vitals:  ? 04/09/21 0805 04/09/21 0815  ?BP: 105/71 116/83  ?Pulse: 84 71  ?Resp: 14 10  ?Temp:    ?SpO2: 100% 100%  ?  ?Last Pain:  ?Vitals:  ? 04/09/21 0815  ?TempSrc:   ?PainSc: 0-No pain  ? ? ?  ?  ?  ?  ?  ?  ? ?Precious Haws Allyssa Abruzzese ? ? ? ? ?

## 2021-04-09 NOTE — Op Note (Addendum)
[facility] ?Gastroenterology ?Patient Name: Tammy Schroeder ?Procedure Date: 04/09/2021 7:29 AM ?MRN: [id number] ?Account #: [id number] ?Date of Birth: [birth year] ?Admit Type: Outpatient ?Age: 56 ?Room: [facility] ENDO ROOM 1 ?Gender: Female ?Note Status: Supervisor Override ?Instrument Name: Upper Endoscope [id number] ?Procedure:             Upper GI endoscopy ?Indications:           Suspected esophageal reflux ?Providers:             Robert Bellow, MD ?Referring MD:          Rubbie Battiest Iona Beard MD, MD (Referring MD) ?Medicines:             Propofol per Anesthesia ?Complications:         No immediate complications. ?Procedure:             Pre-Anesthesia Assessment: ?                       - Prior to the procedure, a History and Physical was  ?                       performed, and patient medications, allergies and  ?                       sensitivities were reviewed. The patient's tolerance  ?                       of previous anesthesia was reviewed. ?                       - The risks and benefits of the procedure and the  ?                       sedation options and risks were discussed with the  ?                       patient. All questions were answered and informed  ?                       consent was obtained. ?                       After obtaining informed consent, the endoscope was  ?                       passed under direct vision. Throughout the procedure,  ?                       the patient's blood pressure, pulse, and oxygen  ?                       saturations were monitored continuously. The Endoscope  ?                       was introduced through the mouth, and advanced to the  ?                       second part of duodenum. The upper GI endoscopy was  ?  accomplished without difficulty. The patient tolerated  ?                       the procedure well. ?Findings: ?     Bilious fluid was found in the stomach. ?     The stomach was normal. ?     The nasopharynx was  normal. ?     The examined esophagus was normal. Biopsies were taken with a cold  ?     forceps for histology. ?Impression:            - Normal esophagus. ?                       - Bilious gastric fluid. ?                       - Normal stomach. ?                       - No specimens collected. ?Recommendation:        - Telephone endoscopist for pathology results in 1  ?                       week. ?Procedure Code(s):     --- Professional --- ?                       [phone], Esophagogastroduodenoscopy, flexible,  ?                       transoral; diagnostic, including collection of  ?                       specimen(s) by brushing or washing, when performed  ?                       (separate procedure) ?CPT copyright 2019 American Medical Association. All rights reserved. ?The codes documented in this report are preliminary and upon coder review may  ?be revised to meet current compliance requirements. ?Robert Bellow, MD ?04/09/2021 7:44:31 AM ?This report has been signed electronically. ?Number of Addenda: 0 ?Note Initiated On: 04/09/2021 7:29 AM ?Estimated Blood Loss:  Estimated blood loss: none. ?     [facility] ?

## 2021-04-09 NOTE — H&P (Signed)
Tammy Schroeder ?[id number] ?19-Oct-1965 ? ?  ? ?HPI:  History suggestive of free reflux. For EGD.  ? ?Medications Prior to Admission  ?Medication Sig Dispense Refill Last Dose  ? amitriptyline (ELAVIL) 25 MG tablet Take 25 mg by mouth at bedtime.   11   ? atorvastatin (LIPITOR) 10 MG tablet Take 10 mg by mouth daily.     ? benzonatate (TESSALON) 200 MG capsule Take one cap TID PRN cough (Patient not taking: Reported on 03/13/2020) 30 capsule 0   ? BIOTIN PO Take 1 tablet by mouth daily.     ? chlorpheniramine-HYDROcodone (TUSSIONEX PENNKINETIC ER) 10-8 MG/5ML SUER Take 5 mLs by mouth 2 (two) times daily. 115 mL 0   ? citalopram (CELEXA) 20 MG tablet Take 10 mg by mouth daily.  (Patient not taking: Reported on 03/13/2020)     ? CRANBERRY PO Take 2 tablets by mouth daily.     ? docusate sodium (COLACE) 100 MG capsule Take 2 capsules by mouth daily as needed (constipation).      ? estradiol (ESTRACE) 2 MG tablet Take 2 mg by mouth daily.      ? gabapentin (NEURONTIN) 100 MG capsule Take 300 mg by mouth 3 (three) times daily.      ? meloxicam (MOBIC) 15 MG tablet Take 15 mg by mouth daily.     ? pantoprazole (PROTONIX) 40 MG tablet Take 40 mg by mouth daily.     ? SUMAtriptan (IMITREX) 100 MG tablet Take 1 tablet by mouth every 2 (two) hours as needed for migraine.   5   ? traZODone (DESYREL) 150 MG tablet Take 150 mg by mouth at bedtime.      ? zolpidem (AMBIEN CR) 12.5 MG CR tablet Take 12.5 mg by mouth at bedtime as needed for sleep.     ? ?Allergies  ?Allergen Reactions  ? Sulfa Antibiotics Itching  ? ?Past Medical History:  ?Diagnosis Date  ? Breast cancer ([place])   ? Cervical cancer ([place])   ? age 16  ? Depression   ? Genetic testing 05/04/2013  ? 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 GERD (gastroesophageal reflux disease)   ? H. pylori infection   ? History of hiatal hernia   ? Neuropathy   ? PONV (postoperative nausea and vomiting)   ? Seizures ([place]) 2007  ? 1 seizure from being taken off of a migraine medication abruptly   ? Skin cancer 2013, 2014  ? ?Past Surgical History:  ?Procedure Laterality Date  ? ABDOMINAL HYSTERECTOMY  age 30  ? APPENDECTOMY    ? BREAST BIOPSY Left 2011  ? bx/clip-Vacuum biopsy 2:00 position, benign breast tissue with fibrocystic and fibroadenoma-like changes.  ? BREAST BIOPSY Right 06/08/2017  ? affirm bx x clip     neg  ? BREAST BIOPSY Right 06/18/2017  ? Procedure: BREAST BIOPSY;  Surgeon: Robert Bellow, MD;  Location: [facility] ORS;  Service: General;  Laterality: Right;  ? BREAST CYST ASPIRATION Right   ? neg  ? BREAST SURGERY Left 10/07/2009  ? Vacuum biopsy 2:00 position, benign breast tissue with fibrocystic and fibroadenoma-like changes.  ? CHOLECYSTECTOMY  May 20, 2004  ? Chronic cholecystitis and cholelithiasis  ? COLONOSCOPY  2006  ? COLONOSCOPY N/A 08/22/2014  ? Procedure: COLONOSCOPY;  Surgeon: Robert Bellow, MD;  Location: [facility] ENDOSCOPY;  Service: Endoscopy;  Laterality: N/A;  ? COLONOSCOPY W/ POLYPECTOMY N/A 08/29/2009  ? Tubular adenoma from the ascending colon. Gaylyn Cheers, M.D.  ?  COLONOSCOPY WITH PROPOFOL N/A 03/13/2020  ? Procedure: COLONOSCOPY WITH PROPOFOL;  Surgeon: Robert Bellow, MD;  Location: [facility] ENDOSCOPY;  Service: Endoscopy;  Laterality: N/A;  ? LASIK  2007  ? OOPHORECTOMY  2005  ? ?Social History  ? ?Socioeconomic History  ? Marital status: Married  ?  Spouse name: Not on file  ? Number of children: Not on file  ? Years of education: Not on file  ? Highest education level: Not on file  ?Occupational History  ? Not on file  ?Tobacco Use  ? Smoking status: Former  ?  Packs/day: 1.00  ?  Years: 29.00  ?  Pack years: 29.00  ?  Types: Cigarettes  ?  Quit date: 04/02/2007  ?  Years since quitting: 14.0  ? Smokeless tobacco: Never  ?Vaping Use  ? Vaping Use: Never used  ?Substance and Sexual Activity  ? Alcohol use: Yes  ?  Comment: rarely  ? Drug use: No  ? Sexual activity: Yes  ?Other Topics Concern  ? Not on file  ?Social History Narrative  ? Not on file  ? ?Social  Determinants of Health  ? ?Financial Resource Strain: Not on file  ?Food Insecurity: Not on file  ?Transportation Needs: Not on file  ?Physical Activity: Not on file  ?Stress: Not on file  ?Social Connections: Not on file  ?Intimate Partner Violence: Not on file  ? ?Social History  ? ?Social History Narrative  ? Not on file  ? ? ? ?ROS: Negative.  ? ? ? ?PE: ?HEENT: Negative. ?Lungs: Clear. ?Cardio: RR. ? ? ?Assessment/Plan: ? ?Proceed with planned endoscopy.  ? ?Robert Bellow ?04/09/2021 ? ?  ?

## 2021-04-09 NOTE — Anesthesia Preprocedure Evaluation (Addendum)
Anesthesia Evaluation  ?Patient identified by MRN, date of birth, ID band ?Patient awake ? ? ? ?Reviewed: ?Allergy & Precautions, NPO status , Patient's Chart, lab work & pertinent test results ? ?History of Anesthesia Complications ?(+) PONV ? ?Airway ?Mallampati: III ? ?TM Distance: >3 FB ?Neck ROM: full ? ? ? Dental ? ?(+) Chipped ?  ?Pulmonary ?neg shortness of breath, former smoker,  ?  ?Pulmonary exam normal ? ? ? ? ? ? ? Cardiovascular ?Exercise Tolerance: Good ?(-) anginanegative cardio ROS ?Normal cardiovascular exam ? ? ?  ?Neuro/Psych ?Seizures -, Well Controlled,  negative psych ROS  ? GI/Hepatic ?Neg liver ROS, hiatal hernia, GERD  Controlled,  ?Endo/Other  ?negative endocrine ROS ? Renal/GU ?negative Renal ROS  ?negative genitourinary ?  ?Musculoskeletal ? ? Abdominal ?  ?Peds ? Hematology ?negative hematology ROS ?(+)   ?Anesthesia Other Findings ?Past Medical History: ?No date: Breast cancer ([place]) ?No date: Cervical cancer ([place]) ?    Comment:  age 56 ?No date: Depression ?05/04/2013: Genetic testing ?    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 pylori infection ?No date: History of hiatal hernia ?No date: Neuropathy ?2007: Seizures ([place]) ?    Comment:  1 seizure from being taken off of a migraine medication  ?             abruptly ?2013, 2014: Skin cancer ? ?Past Surgical History: ?age 38: ABDOMINAL HYSTERECTOMY ?No date: APPENDECTOMY ?2011: BREAST BIOPSY; Left ?    Comment:  bx/clip-Vacuum biopsy 2:00 position, benign breast  ?             tissue with fibrocystic and fibroadenoma-like changes. ?06/08/2017: BREAST BIOPSY; Right ?    Comment:  affirm bx x clip     neg ?06/18/2017: BREAST BIOPSY; Right ?    Comment:  Procedure: BREAST BIOPSY;  Surgeon: Robert Bellow,  ?             MD;  Location: [facility] ORS;  Service: General;  Laterality:  ?             Right; ?No date: BREAST CYST ASPIRATION; Right ?     Comment:  neg ?10/07/2009: BREAST SURGERY; Left ?    Comment:  Vacuum biopsy 2:00 position, benign breast tissue with  ?             fibrocystic and fibroadenoma-like changes. ?May 20, 2004: CHOLECYSTECTOMY ?    Comment:  Chronic cholecystitis and cholelithiasis ?2006: COLONOSCOPY ?08/22/2014: COLONOSCOPY; N/A ?    Comment:  Procedure: COLONOSCOPY;  Surgeon: Robert Bellow, MD; ?             Location: [facility] ENDOSCOPY;  Service: Endoscopy;   ?             Laterality: N/A; ?08/29/2009: COLONOSCOPY W/ POLYPECTOMY; N/A ?    Comment:  Tubular adenoma from the ascending colon. Robert  ?             Vira Agar, M.D. ?03/13/2020: COLONOSCOPY WITH PROPOFOL; N/A ?    Comment:  Procedure: COLONOSCOPY WITH PROPOFOL;  Surgeon: Bary Castilla, ?             Forest Gleason, MD;  Location: [facility] ENDOSCOPY;  Service:  ?             Endoscopy;  Laterality: N/A; ?2007: LASIK ?2005: OOPHORECTOMY ? ?BMI   ? Body Mass Index: 24.96 kg/m?  ?  ? ? Reproductive/Obstetrics ?  negative OB ROS ? ?  ? ? ? ? ? ? ? ? ? ? ? ? ? ?  ?  ? ? ? ? ? ? ? ?Anesthesia Physical ?Anesthesia Plan ? ?ASA: 3 ? ?Anesthesia Plan: General  ? ?Post-op Pain Management:   ? ?Induction: Intravenous ? ?PONV Risk Score and Plan: Propofol infusion and TIVA ? ?Airway Management Planned: Natural Airway and Nasal Cannula ? ?Additional Equipment:  ? ?Intra-op Plan:  ? ?Post-operative Plan:  ? ?Informed Consent: I have reviewed the patients History and Physical, chart, labs and discussed the procedure including the risks, benefits and alternatives for the proposed anesthesia with the patient or authorized representative who has indicated his/her understanding and acceptance.  ? ? ? ?Dental Advisory Given ? ?Plan Discussed with: Anesthesiologist, CRNA and Surgeon ? ?Anesthesia Plan Comments: (Patient consented for risks of anesthesia including but not limited to:  ?- adverse reactions to medications ?- risk of airway placement if required ?- damage to eyes, teeth, lips or other oral mucosa ?-  nerve damage due to positioning  ?- sore throat or hoarseness ?- Damage to heart, brain, nerves, lungs, other parts of body or loss of life ? ?Patient voiced understanding.)  ? ? ? ? ? ? ?Anesthesia Quick Evaluation ? ?

## 2021-04-09 NOTE — Transfer of Care (Signed)
Immediate Anesthesia Transfer of Care Note ? ?Patient: Tammy Schroeder ? ?Procedure(s) Performed: ESOPHAGOGASTRODUODENOSCOPY (EGD) WITH PROPOFOL ? ?Patient Location: Endoscopy Unit ? ?Anesthesia Type:General ? ?Level of Consciousness: drowsy ? ?Airway & Oxygen Therapy: Patient Spontanous Breathing ? ?Post-op Assessment: Report given to RN and Post -op Vital signs reviewed and stable ? ?Post vital signs: Reviewed and stable ? ?Last Vitals:  ?Vitals Value Taken Time  ?BP 94/53 04/09/21 0745  ?Temp    ?Pulse 83 04/09/21 0745  ?Resp 13 04/09/21 0745  ?SpO2 100 % 04/09/21 0745  ? ? ?Last Pain:  ?Vitals:  ? 04/09/21 0745  ?TempSrc:   ?PainSc: Asleep  ?   ? ?  ? ?Complications: No notable events documented. ?

## 2021-04-10 LAB — SURGICAL PATHOLOGY

## 2022-09-04 IMAGING — CT CT CHEST W/O CM
1 series · 15 of 34 positions shown, 19 images · non-contrast
Comparison: An outside report of an exam of 12/14/2017.

CLINICAL DATA: Follow-up of pulmonary nodule. History of skin
cancer.

EXAM:
CT CHEST WITHOUT CONTRAST
TECHNIQUE: Multidetector CT imaging of the chest was performed following the
standard protocol without IV contrast.

[Series 2: thorax · axial · 0.68mm/px · z∈[-628,-380]mm · 15 of 146 slices shown, 19 images]
[im 11/146  mediastinal]
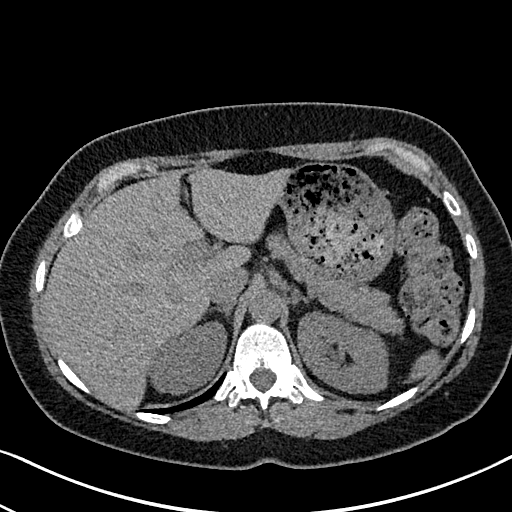
[im 11/146  lung]
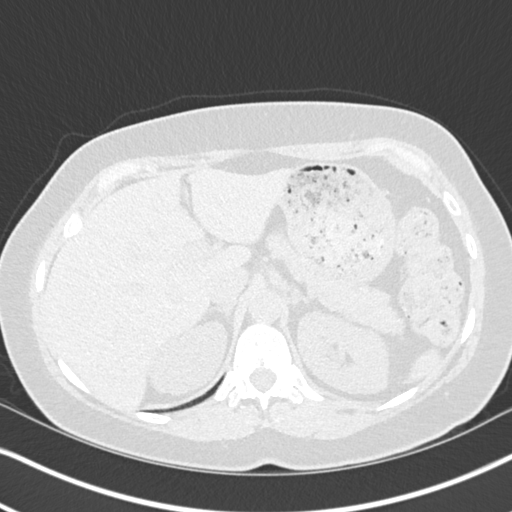
[im 22/146  lung]
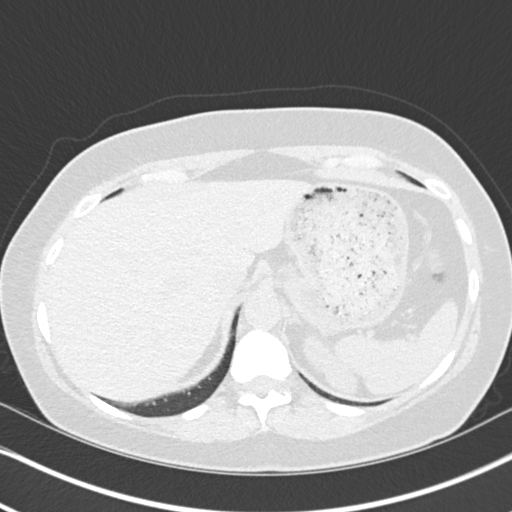
[im 30/146  lung]
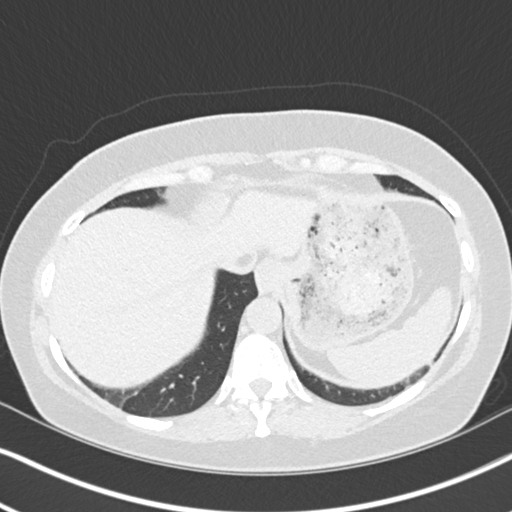
[im 38/146  lung]
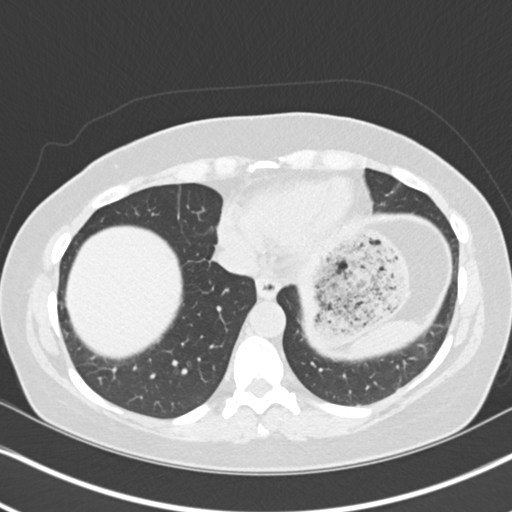
[im 49/146  mediastinal]
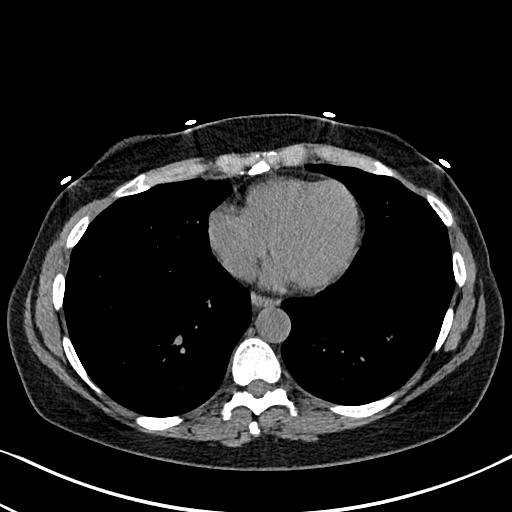
[im 49/146  lung]
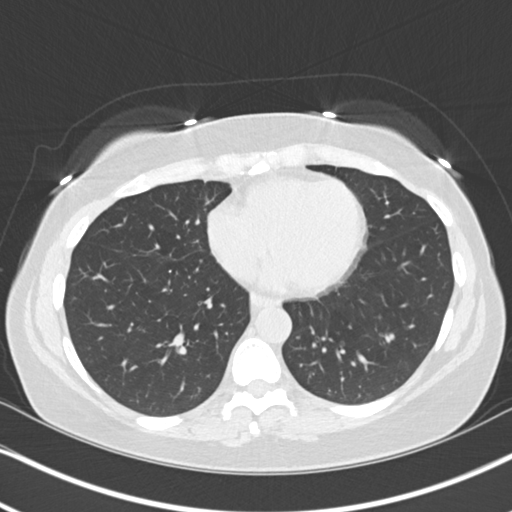
[im 59/146  lung]
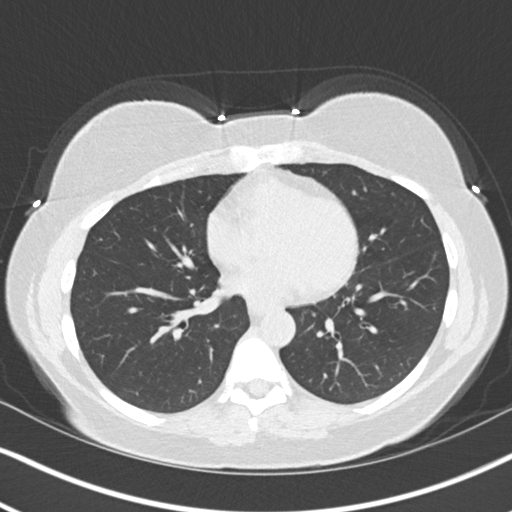
[im 65/146  lung]
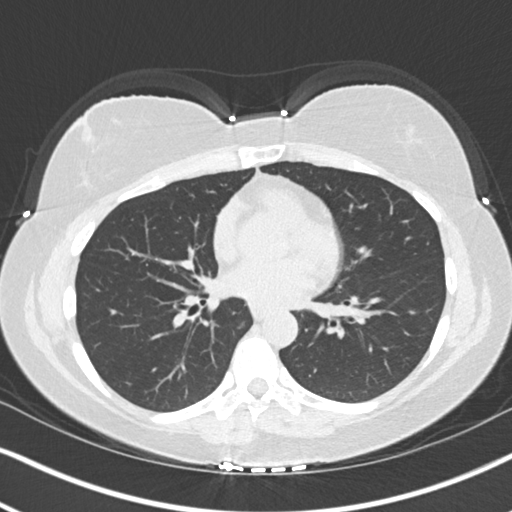
[im 76/146  lung]
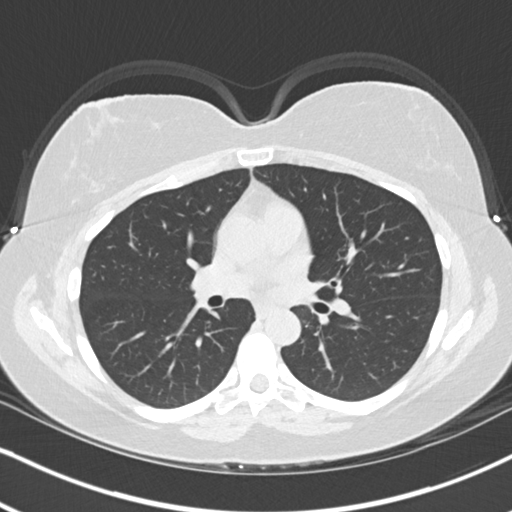
[im 81/146  mediastinal]
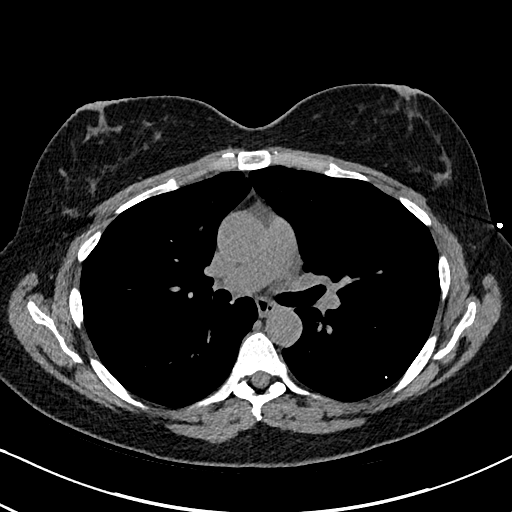
[im 81/146  lung]
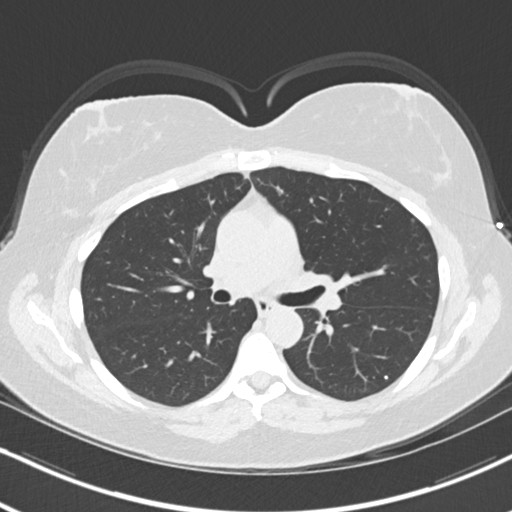
[im 88/146  lung]
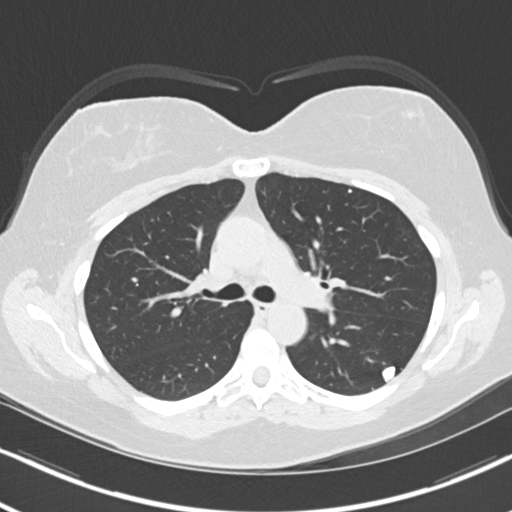
[im 97/146  lung]
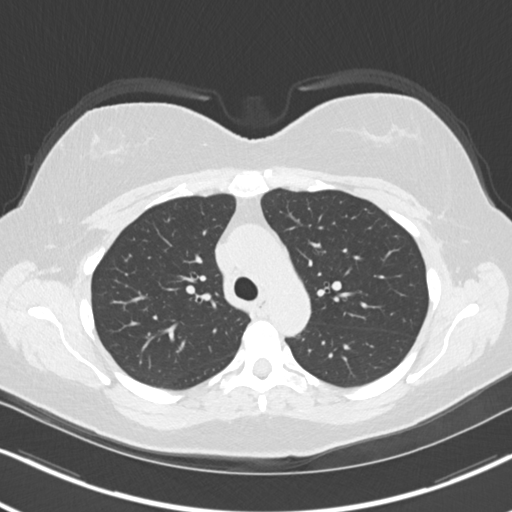
[im 108/146  lung]
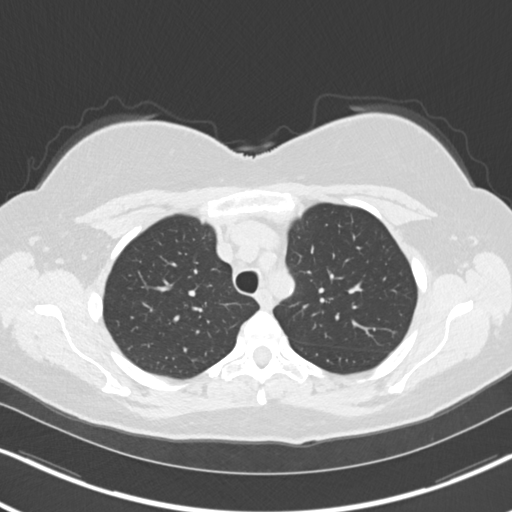
[im 117/146  mediastinal]
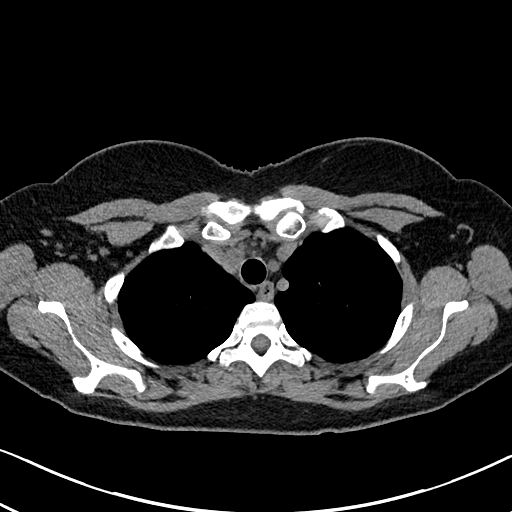
[im 117/146  lung]
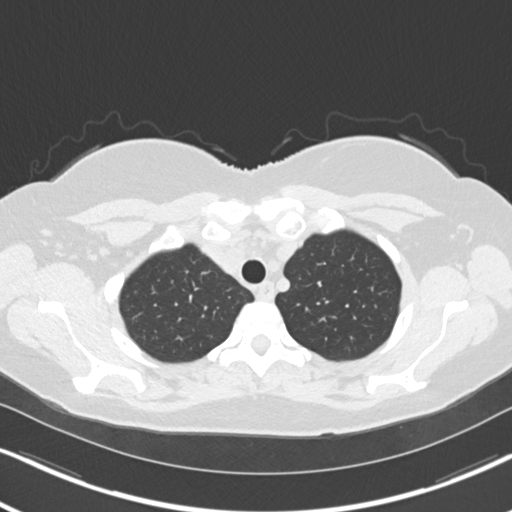
[im 124/146  lung]
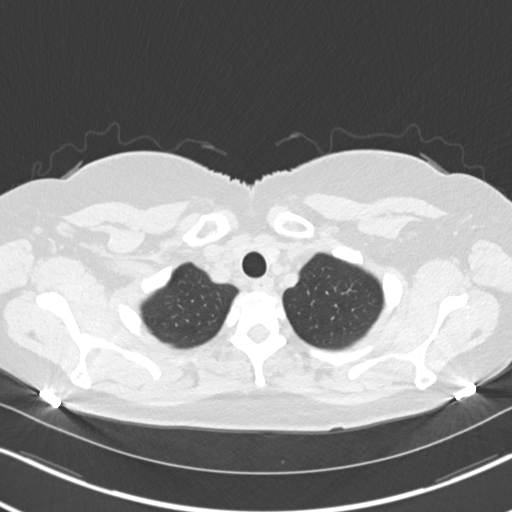
[im 135/146  lung]
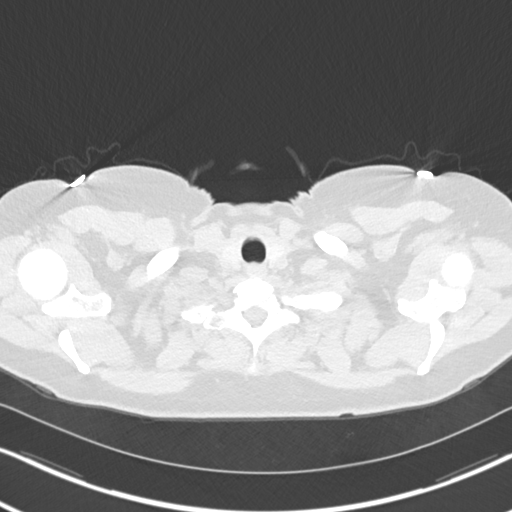

[15 of 34 positions shown; findings below may reference images not displayed]

This
describes a 3 mm left upper lobe pulmonary nodule and multiple
calcified granulomas. For
FINDINGS: Cardiovascular: Aortic atherosclerosis. Tortuous thoracic aorta.
Normal heart size, without pericardial effusion.

Mediastinum/Nodes: No mediastinal or definite hilar adenopathy,
given limitations of unenhanced CT.

Lungs/Pleura: No pleural fluid. Innumerable, bilateral calcified
granulomas.

The largest is in the left lower lobe at 11 mm.

No suspicious dominant noncalcified pulmonary nodule identified.

Upper Abdomen: Cholecystectomy. Normal imaged portions of the
spleen, stomach, pancreas, adrenal glands, kidneys.

Musculoskeletal: No acute osseous abnormality.
IMPRESSION: 1.  No acute process in the chest.
2. Calcified bilateral granulomas. No dominant suspicious
noncalcified pulmonary nodule identified.
3.  Aortic Atherosclerosis ([id number]).

## 2023-04-27 ENCOUNTER — Other Ambulatory Visit: Payer: Self-pay | Admitting: Family Medicine

## 2023-04-27 DIAGNOSIS — R220 Localized swelling, mass and lump, head: Secondary | ICD-10-CM

## 2023-04-27 DIAGNOSIS — R040 Epistaxis: Secondary | ICD-10-CM

## 2023-04-28 ENCOUNTER — Ambulatory Visit
Admission: RE | Admit: 2023-04-28 | Discharge: 2023-04-28 | Disposition: A | Discharge status: Home / Self Care | Source: Ambulatory Visit | Attending: Family Medicine | Admitting: Family Medicine

## 2023-04-28 DIAGNOSIS — R220 Localized swelling, mass and lump, head: Secondary | ICD-10-CM | POA: Insufficient documentation

## 2023-04-28 DIAGNOSIS — R040 Epistaxis: Secondary | ICD-10-CM | POA: Insufficient documentation

## 2023-04-28 MED ORDER — IOHEXOL 300 MG/ML  SOLN
75.0000 mL | Freq: Once | INTRAMUSCULAR | Status: AC | PRN
  Administered 2023-04-28: 75 mL via INTRAVENOUS
